# Patient Record
Sex: Female | Born: 1995 | Race: Black or African American | Hispanic: No | Marital: Single | Smoking: Never smoker
Health system: Southern US, Community
[De-identification: ages and names within clinical notes are randomized; demographics above are authoritative.]

## PROBLEM LIST (undated history)

## (undated) DIAGNOSIS — E669 Obesity, unspecified: Secondary | ICD-10-CM

## (undated) DIAGNOSIS — G932 Benign intracranial hypertension: Secondary | ICD-10-CM

## (undated) DIAGNOSIS — J45909 Unspecified asthma, uncomplicated: Secondary | ICD-10-CM

## (undated) HISTORY — PX: OTHER SURGICAL HISTORY: SHX169

## (undated) HISTORY — DX: Benign intracranial hypertension: G93.2

---
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
## 2014-08-27 ENCOUNTER — Emergency Department (INDEPENDENT_AMBULATORY_CARE_PROVIDER_SITE_OTHER)
Admission: EM | Admit: 2014-08-27 | Discharge: 2014-08-27 | Disposition: A | Discharge status: Home / Self Care | Payer: PRIVATE HEALTH INSURANCE | Source: Home / Self Care | Attending: Family Medicine | Admitting: Family Medicine

## 2014-08-27 ENCOUNTER — Encounter (HOSPITAL_COMMUNITY): Payer: Self-pay | Admitting: Emergency Medicine

## 2014-08-27 DIAGNOSIS — J4531 Mild persistent asthma with (acute) exacerbation: Secondary | ICD-10-CM

## 2014-08-27 HISTORY — DX: Unspecified asthma, uncomplicated: J45.909

## 2014-08-27 MED ORDER — ALBUTEROL SULFATE (2.5 MG/3ML) 0.083% IN NEBU
5.0000 mg | INHALATION_SOLUTION | Freq: Once | RESPIRATORY_TRACT | Status: AC
  Administered 2014-08-27: 5 mg via RESPIRATORY_TRACT

## 2014-08-27 MED ORDER — ALBUTEROL SULFATE (2.5 MG/3ML) 0.083% IN NEBU
INHALATION_SOLUTION | RESPIRATORY_TRACT | Status: AC
  Filled 2014-08-27: qty 3

## 2014-08-27 MED ORDER — ALBUTEROL SULFATE (2.5 MG/3ML) 0.083% IN NEBU
INHALATION_SOLUTION | RESPIRATORY_TRACT | Status: AC
  Filled 2014-08-27: qty 6

## 2014-08-27 MED ORDER — TRIAMCINOLONE ACETONIDE 40 MG/ML IJ SUSP
INTRAMUSCULAR | Status: AC
  Filled 2014-08-27: qty 1

## 2014-08-27 MED ORDER — METHYLPREDNISOLONE 4 MG PO KIT: PACK | ORAL | Status: DC

## 2014-08-27 MED ORDER — TRIAMCINOLONE ACETONIDE 40 MG/ML IJ SUSP
40.0000 mg | Freq: Once | INTRAMUSCULAR | Status: AC
  Administered 2014-08-27: 40 mg via INTRAMUSCULAR

## 2014-08-27 MED ORDER — IPRATROPIUM BROMIDE 0.02 % IN SOLN
0.5000 mg | Freq: Once | RESPIRATORY_TRACT | Status: AC
  Administered 2014-08-27: 0.5 mg via RESPIRATORY_TRACT

## 2014-08-27 MED ORDER — METHYLPREDNISOLONE ACETATE 40 MG/ML IJ SUSP
80.0000 mg | Freq: Once | INTRAMUSCULAR | Status: AC
  Administered 2014-08-27: 80 mg via INTRAMUSCULAR

## 2014-08-27 MED ORDER — METHYLPREDNISOLONE ACETATE 80 MG/ML IJ SUSP
INTRAMUSCULAR | Status: AC
  Filled 2014-08-27: qty 1

## 2014-08-27 MED ORDER — IPRATROPIUM BROMIDE 0.02 % IN SOLN
RESPIRATORY_TRACT | Status: AC
  Filled 2014-08-27: qty 2.5

## 2014-08-27 MED ORDER — ALBUTEROL SULFATE HFA 108 (90 BASE) MCG/ACT IN AERS: 1.0000 | INHALATION_SPRAY | Freq: Four times a day (QID) | RESPIRATORY_TRACT | Status: DC | PRN

## 2014-08-27 NOTE — Discharge Instructions (Signed)
See your doctor on wed for follow-up as planned.

## 2014-08-27 NOTE — ED Notes (Signed)
Reports having sob for several months.   States was told by provider that she wasn't using inhaler correctly.  Having no relief with asthma meds.

## 2014-08-27 NOTE — ED Provider Notes (Signed)
CSN: [id number]     Arrival date & time 08/27/14  1740 History   First MD Initiated Contact with Patient 08/27/14 1756     Chief Complaint  Patient presents with  . Asthma  . Shortness of Breath   (Consider location/radiation/quality/duration/timing/severity/associated sxs/prior Treatment) Patient is a 18 y.o. female presenting with asthma and shortness of breath. The history is provided by the patient.  Asthma This is a new problem. The current episode started more than 1 week ago (20mo h/o asthma flare.). Associated symptoms include shortness of breath.  Shortness of Breath Associated symptoms: cough and wheezing     Past Medical History  Diagnosis Date  . Asthma    History reviewed. No pertinent past surgical history. History reviewed. No pertinent family history. History  Substance Use Topics  . Smoking status: Never Smoker   . Smokeless tobacco: Not on file  . Alcohol Use: No   OB History   Grav Para Term Preterm Abortions TAB SAB Ect Mult Living                 Review of Systems  Constitutional: Negative.   HENT: Negative.   Respiratory: Positive for cough, shortness of breath and wheezing.     Allergies  Review of patient's allergies indicates no known allergies.  Home Medications   Prior to Admission medications   Medication Sig Start Date End Date Taking? Authorizing Provider  albuterol (PROVENTIL HFA;VENTOLIN HFA) 108 (90 BASE) MCG/ACT inhaler Inhale into the lungs every 6 (six) hours as needed for wheezing or shortness of breath.   Yes Historical Provider, MD  beclomethasone (QVAR) 40 MCG/ACT inhaler Inhale into the lungs 2 (two) times daily.   Yes Historical Provider, MD  methylPREDNISolone (MEDROL DOSEPAK) 4 MG tablet follow package directions, start on wed, take until finished. 08/27/14   Linna HoffJames D Kindl, MD   BP 117/67  Pulse 103  Temp(Src) 99 F (37.2 C) (Oral)  Resp 16  SpO2 100%  LMP 08/22/2014 Physical Exam  Nursing note and vitals  reviewed. Constitutional: She is oriented to person, place, and time. She appears well-developed and well-nourished.  Neck: Normal range of motion. Neck supple.  Cardiovascular: Regular rhythm, normal heart sounds and intact distal pulses.   Pulmonary/Chest: Effort normal. She has wheezes.  Neurological: She is alert and oriented to person, place, and time.  Skin: Skin is warm and dry.    ED Course  Procedures (including critical care time) Labs Review Labs Reviewed - No data to display Peak flow 220. Imaging Review No results found.   MDM   1. Asthma exacerbation attacks, mild persistent    Peak flow 300 prior to 2nd neb, still wheezing. Peak flow after 2nd neb prior to d/c was 320. Sx improved from onset.  Linna HoffJames D Kindl, MD 08/27/14 [phone]

## 2015-04-18 ENCOUNTER — Encounter (HOSPITAL_COMMUNITY): Payer: Self-pay

## 2015-04-18 ENCOUNTER — Emergency Department (HOSPITAL_COMMUNITY)
Admission: EM | Admit: 2015-04-18 | Discharge: 2015-04-18 | Discharge status: Absent without leave | Payer: Self-pay | Source: Home / Self Care

## 2015-04-18 ENCOUNTER — Emergency Department (HOSPITAL_COMMUNITY)
Admission: EM | Admit: 2015-04-18 | Discharge: 2015-04-18 | Disposition: A | Discharge status: Home / Self Care | Payer: PRIVATE HEALTH INSURANCE | Source: Home / Self Care | Attending: Family Medicine | Admitting: Family Medicine

## 2015-04-18 DIAGNOSIS — J020 Streptococcal pharyngitis: Secondary | ICD-10-CM | POA: Diagnosis not present

## 2015-04-18 LAB — POCT RAPID STREP A: Streptococcus, Group A Screen (Direct): POSITIVE — AB

## 2015-04-18 MED ORDER — AMOXICILLIN 500 MG PO CAPS: 500.0000 mg | ORAL_CAPSULE | Freq: Three times a day (TID) | ORAL | Status: DC

## 2015-04-18 NOTE — Discharge Instructions (Signed)
Drink lots of fluids, take all of medicine, use lozenges as needed.return if needed °

## 2015-04-18 NOTE — ED Notes (Signed)
Sore throat. MD eval

## 2015-04-18 NOTE — ED Provider Notes (Signed)
CSN: [id number]     Arrival date & time 04/18/15  1435 History   First MD Initiated Contact with Patient 04/18/15 1450     Chief Complaint  Patient presents with  . Sore Throat   (Consider location/radiation/quality/duration/timing/severity/associated sxs/prior Treatment) Patient is a 19 y.o. female presenting with pharyngitis. The history is provided by the patient.  Sore Throat This is a new problem. The current episode started 2 days ago. The problem has been gradually worsening. Pertinent negatives include no chest pain, no abdominal pain, no headaches and no shortness of breath. The symptoms are aggravated by swallowing.    Past Medical History  Diagnosis Date  . Asthma    History reviewed. No pertinent past surgical history. History reviewed. No pertinent family history. History  Substance Use Topics  . Smoking status: Never Smoker   . Smokeless tobacco: Not on file  . Alcohol Use: No   OB History    No data available     Review of Systems  Constitutional: Positive for fever, chills, activity change and appetite change.  HENT: Positive for sore throat. Negative for congestion, postnasal drip and rhinorrhea.   Respiratory: Negative for shortness of breath.   Cardiovascular: Negative.  Negative for chest pain.  Gastrointestinal: Negative.  Negative for abdominal pain.  Skin: Negative for rash.  Neurological: Negative for headaches.    Allergies  Review of patient's allergies indicates no known allergies.  Home Medications   Prior to Admission medications   Medication Sig Start Date End Date Taking? Authorizing Provider  albuterol (PROVENTIL HFA;VENTOLIN HFA) 108 (90 BASE) MCG/ACT inhaler Inhale into the lungs every 6 (six) hours as needed for wheezing or shortness of breath.    Historical Provider, MD  albuterol (PROVENTIL HFA;VENTOLIN HFA) 108 (90 BASE) MCG/ACT inhaler Inhale 1-2 puffs into the lungs every 6 (six) hours as needed for wheezing or shortness of breath.  08/27/14   Linna HoffJames D Earl Zellmer, MD  amoxicillin (AMOXIL) 500 MG capsule Take 1 capsule (500 mg total) by mouth 3 (three) times daily. 04/18/15   Linna HoffJames D Othello Sgroi, MD  beclomethasone (QVAR) 40 MCG/ACT inhaler Inhale into the lungs 2 (two) times daily.    Historical Provider, MD  methylPREDNISolone (MEDROL DOSEPAK) 4 MG tablet follow package directions, start on wed, take until finished. 08/27/14   Linna HoffJames D Tyreke Kaeser, MD   BP 109/78 mmHg  Pulse 113  Temp(Src) 99.4 F (37.4 C) (Oral)  Resp 16  SpO2 99% Physical Exam  Constitutional: She is oriented to person, place, and time. She appears well-developed and well-nourished.  HENT:  Right Ear: External ear normal.  Left Ear: External ear normal.  Mouth/Throat: Oropharyngeal exudate present.  Neck: Normal range of motion. Neck supple.  Cardiovascular: Normal heart sounds.   Pulmonary/Chest: Breath sounds normal.  Lymphadenopathy:    She has cervical adenopathy.  Neurological: She is alert and oriented to person, place, and time.  Skin: Skin is warm and dry.  Nursing note and vitals reviewed.   ED Course  Procedures (including critical care time) Labs Review Labs Reviewed  POCT RAPID STREP A - Abnormal; Notable for the following:    Streptococcus, Group A Screen (Direct) POSITIVE (*)    All other components within normal limits   Strep positive  Imaging Review No results found.   MDM   1. Streptococcal sore throat        Linna HoffJames D Meghan Tiemann, MD 04/18/15 [phone]

## 2016-08-11 ENCOUNTER — Ambulatory Visit (HOSPITAL_COMMUNITY)
Admission: EM | Admit: 2016-08-11 | Discharge: 2016-08-11 | Disposition: A | Discharge status: Home / Self Care | Payer: PRIVATE HEALTH INSURANCE | Attending: Internal Medicine | Admitting: Internal Medicine

## 2016-08-11 ENCOUNTER — Encounter (HOSPITAL_COMMUNITY): Payer: Self-pay | Admitting: Emergency Medicine

## 2016-08-11 DIAGNOSIS — H6503 Acute serous otitis media, bilateral: Secondary | ICD-10-CM

## 2016-08-11 NOTE — Discharge Instructions (Signed)
Use the medications that you have at home.   Flonase may help your symptoms.  There is no infection in your ears at this time.

## 2016-08-11 NOTE — ED Triage Notes (Signed)
[facility] a [facility] of bilateral ear fullness x 1 month.

## 2016-08-11 NOTE — ED Provider Notes (Signed)
CSN: [id number]     Arrival date & time 08/11/16  1424 History   None    Chief Complaint  Patient presents with  . Ear Fullness   (Consider location/radiation/quality/duration/timing/severity/associated sxs/prior Treatment) HPI 20 y/o female with sensation of wax buildup in both ears. Left is worse than right. Recent URI. No pain.  Has lots of meds at home including flonase. Similar symptoms in the past.   Past Medical History:  Diagnosis Date  . Asthma    History reviewed. No pertinent surgical history. History reviewed. No pertinent family history. Social History  Substance Use Topics  . Smoking status: Never Smoker  . Smokeless tobacco: Not on file  . Alcohol use No   OB History    No data available     Review of Systems  Denies: HEADACHE, NAUSEA, ABDOMINAL PAIN, CHEST PAIN, CONGESTION, DYSURIA, SHORTNESS OF BREATH  Allergies  Review of patient's allergies indicates no known allergies.  Home Medications   Prior to Admission medications   Medication Sig Start Date End Date Taking? Authorizing Provider  albuterol (PROVENTIL HFA;VENTOLIN HFA) 108 (90 BASE) MCG/ACT inhaler Inhale into the lungs every 6 (six) hours as needed for wheezing or shortness of breath.   Yes Historical Provider, MD  beclomethasone (QVAR) 40 MCG/ACT inhaler Inhale into the lungs 2 (two) times daily.   Yes Historical Provider, MD  albuterol (PROVENTIL HFA;VENTOLIN HFA) 108 (90 BASE) MCG/ACT inhaler Inhale 1-2 puffs into the lungs every 6 (six) hours as needed for wheezing or shortness of breath. 08/27/14   Linna HoffJames D Kindl, MD  amoxicillin (AMOXIL) 500 MG capsule Take 1 capsule (500 mg total) by mouth 3 (three) times daily. 04/18/15   Linna HoffJames D Kindl, MD  methylPREDNISolone (MEDROL DOSEPAK) 4 MG tablet follow package directions, start on wed, take until finished. 08/27/14   Linna HoffJames D Kindl, MD   Meds Ordered and Administered this Visit  Medications - No data to display  BP 131/87 (BP Location: Left Arm)    Pulse 83   Temp 98 F (36.7 C) (Oral)   Resp 20   SpO2 100%  No data found.   Physical Exam NURSES NOTES AND VITAL SIGNS REVIEWED. CONSTITUTIONAL: Well developed, well nourished, no acute distress HEENT: normocephalic, atraumatic, Left TM effusion without redness or bulging. Right TM normal. No cerumen buildup in either ear. , Throat clear.  EYES: Conjunctiva normal NECK:normal ROM, supple, no adenopathy PULMONARY:No respiratory distress, normal effort ABDOMINAL: Soft, ND, NT BS+, No CVAT MUSCULOSKELETAL: Normal ROM of all extremities,  SKIN: warm and dry without rash PSYCHIATRIC: Mood and affect, behavior are normal  Urgent Care Course   Clinical Course    Procedures (including critical care time)  Labs Review Labs Reviewed - No data to display  Imaging Review No results found.   Visual Acuity Review  Right Eye Distance:   Left Eye Distance:   Bilateral Distance:    Right Eye Near:   Left Eye Near:    Bilateral Near:         MDM   1. Bilateral acute serous otitis media, recurrence not specified     Patient is reassured that there are no issues that require transfer to higher level of care at this time or additional tests. Patient is advised to continue home symptomatic treatment. Patient is advised that if there are new or worsening symptoms to attend the emergency department, contact primary care provider, or return to UC. Instructions of care provided discharged home in stable condition.  THIS NOTE WAS GENERATED USING A VOICE RECOGNITION SOFTWARE PROGRAM. ALL REASONABLE EFFORTS  WERE MADE TO PROOFREAD THIS DOCUMENT FOR ACCURACY.  I have verbally reviewed the discharge instructions with the patient. A printed AVS was given to the patient.  All questions were answered prior to discharge.      Tharon Aquas, PA 08/11/16 1626

## 2017-04-18 ENCOUNTER — Encounter (HOSPITAL_COMMUNITY): Payer: Self-pay | Admitting: Emergency Medicine

## 2017-04-18 ENCOUNTER — Ambulatory Visit (HOSPITAL_COMMUNITY)
Admission: EM | Admit: 2017-04-18 | Discharge: 2017-04-18 | Disposition: A | Discharge status: Home / Self Care | Payer: PRIVATE HEALTH INSURANCE | Attending: Emergency Medicine | Admitting: Emergency Medicine

## 2017-04-18 DIAGNOSIS — K115 Sialolithiasis: Secondary | ICD-10-CM | POA: Diagnosis not present

## 2017-04-18 MED ORDER — IBUPROFEN 600 MG PO TABS: 600.0000 mg | ORAL_TABLET | Freq: Four times a day (QID) | ORAL | 0 refills | Status: DC | PRN

## 2017-04-18 NOTE — ED Triage Notes (Signed)
Left side of neck swollen and painful for 3-4 days.  Patient has a stuffy left ear

## 2017-04-18 NOTE — Discharge Instructions (Signed)
continue warm compresses, start  a sour hard candy such as warheads, sour patch kids or lemon drops, ibuprofen 600 mg 4 times a day when necessary.

## 2017-04-18 NOTE — ED Provider Notes (Signed)
HPI  SUBJECTIVE:  Kimberly Villa is a 21 y.o. female who presents with a painful, sore, mass inferior to her left jaw for the past week. States that it waxes and wanes in size, gets bigger with eating and salivating. She tried warm compresses, apple cider vinegar, ibuprofen 400-600 mg with some improvement in her symptoms. Symptoms are worse with eating. She denies fevers, sore throat, ear pain, dental pain or infection. No trismus, neck stiffness, drooling, difficulty breathing. No antibiotic in the past month. She took ibuprofen within 6-8 hours of evaluation. She has a past medical history of asthma. She is not a smoker. No history of sialolithiasis, diabetes, hypertension. LMP: IUD. Denies possibility of being pregnant. PMD: Dr. Parke Simmers at [facility]  Past Medical History:  Diagnosis Date  . Asthma     History reviewed. No pertinent surgical history.  No family history on file.  Social History  Substance Use Topics  . Smoking status: Never Smoker  . Smokeless tobacco: Not on file  . Alcohol use No    No current facility-administered medications for this encounter.   Current Outpatient Prescriptions:  .  albuterol (PROVENTIL HFA;VENTOLIN HFA) 108 (90 BASE) MCG/ACT inhaler, Inhale into the lungs every 6 (six) hours as needed for wheezing or shortness of breath., Disp: , Rfl:  .  albuterol (PROVENTIL HFA;VENTOLIN HFA) 108 (90 BASE) MCG/ACT inhaler, Inhale 1-2 puffs into the lungs every 6 (six) hours as needed for wheezing or shortness of breath., Disp: 1 Inhaler, Rfl: 0 .  beclomethasone (QVAR) 40 MCG/ACT inhaler, Inhale into the lungs 2 (two) times daily., Disp: , Rfl:  .  ibuprofen (ADVIL,MOTRIN) 600 MG tablet, Take 1 tablet (600 mg total) by mouth every 6 (six) hours as needed., Disp: 30 tablet, Rfl: 0  No Known Allergies   ROS  As noted in HPI.   Physical Exam  BP 138/72 (BP Location: Right Arm)   Pulse 87   Temp 98.2 F (36.8 C) (Oral)   Resp 18   SpO2 100%    Constitutional: Well developed, well nourished, no acute distress Eyes:  EOMI, conjunctiva normal bilaterally HENT: Normocephalic, atraumatic,mucus membranes moist. Nontender, firm mass inferior to the left jaw consistent with an enlarged salivary gland. Positive expressible clear saliva. No palpable stone or expressible purulent drainage from Wharton's or Stensen's duct. No parotid gland swelling, tenderness. No drooling, trismus. dentition normal, no tenderness or swelling along the gums Neck: No cervical lymphadenopathy Respiratory: Normal inspiratory effort Cardiovascular: Normal rate GI: nondistended skin: No rash, skin intact Musculoskeletal: no deformities Neurologic: Alert & oriented x 3, no focal neuro deficits Psychiatric: Speech and behavior appropriate   ED Course   Medications - No data to display  No orders of the defined types were placed in this encounter.   No results found for this or any previous visit (from the past 24 hour(s)). No results found.  ED Clinical Impression  Sialolithiasis   ED Assessment/Plan  No evidence of secondary infection. We'll have patient continue warm compresses, start  Sialogogues such war heads, sour patch kids or lemon drops, ibuprofen 600 mg 4 times a day when necessary. We'll have her follow-up with Dr. Jearld Fenton, ENT on call in a week if this is not completely resolved, to the ER for any signs of infection.  Discussed MDM, plan and followup with patient. Discussed sn/sx that should prompt return to the ED. Patient agrees with plan.   Meds ordered this encounter  Medications  . ibuprofen (ADVIL,MOTRIN) 600 MG tablet  Sig: Take 1 tablet (600 mg total) by mouth every 6 (six) hours as needed.    Dispense:  30 tablet    Refill:  0    *This clinic note was created using Scientist, clinical (histocompatibility and immunogenetics)Dragon dictation software. Therefore, there may be occasional mistakes despite careful proofreading.  ?   Domenick GongMortenson, Ramello Cordial, MD 04/18/17 1549

## 2017-05-05 ENCOUNTER — Ambulatory Visit (HOSPITAL_COMMUNITY)
Admission: EM | Admit: 2017-05-05 | Discharge: 2017-05-05 | Disposition: A | Discharge status: Home / Self Care | Payer: PRIVATE HEALTH INSURANCE | Attending: Family Medicine | Admitting: Family Medicine

## 2017-05-05 ENCOUNTER — Encounter (HOSPITAL_COMMUNITY): Payer: Self-pay | Admitting: Emergency Medicine

## 2017-05-05 DIAGNOSIS — K115 Sialolithiasis: Secondary | ICD-10-CM | POA: Diagnosis not present

## 2017-05-05 DIAGNOSIS — J309 Allergic rhinitis, unspecified: Secondary | ICD-10-CM

## 2017-05-05 MED ORDER — TRIAMCINOLONE ACETONIDE 0.1 % EX CREA: 1.0000 "application " | TOPICAL_CREAM | Freq: Two times a day (BID) | CUTANEOUS | 1 refills | Status: DC

## 2017-05-05 NOTE — Discharge Instructions (Signed)
I provided the contact information for ear, nose, and throat physician. Contact his office to set up an appointment for follow-up care.  With regards to your congestion, and ear pressure, most likely of allergic rhinitis, start taking your Zyrtec daily, and I also recommend Flonase 2 sprays each nostril daily. If symptoms persist, follow-up with ENT.

## 2017-05-05 NOTE — ED Triage Notes (Signed)
Pt reports a lot of fullness in the left ear.  She states this is a chronic problem.  She has tried ear wax removal kits at home with no relief.

## 2017-05-05 NOTE — ED Provider Notes (Signed)
CSN: [id number]     Arrival date & time 05/05/17  1345 History   First MD Initiated Contact with Patient 05/05/17 1404     Chief Complaint  Patient presents with  . Ear Fullness    left   (Consider location/radiation/quality/duration/timing/severity/associated sxs/prior Treatment) Vista Sawatzky is a 21 y.o. female with a past history of asthma, who presents [facility] with a chief complaint of salivary stone. She was seen in the clinic on 04/18/2017, diagnosed with a salivary stone then, was instructed that she may need follow-up with ENT if the symptoms persisted. She is here today to obtain a referral. She also has a second complaint of ear pain and pressure, ongoing for 3-4 days, denies any ringing in the ear, no fever, does have congestion, no nausea, vomiting, diarrhea, or other systemic complaints. She takes over-the-counter Zyrtec for allergies, however is been out for a few weeks, and she is also requesting a refill for triamcinolone cream for her eczema.   The history is provided by the patient.    Past Medical History:  Diagnosis Date  . Asthma    History reviewed. No pertinent surgical history. History reviewed. No pertinent family history. Social History  Substance Use Topics  . Smoking status: Never Smoker  . Smokeless tobacco: Never Used  . Alcohol use No   OB History    No data available     Review of Systems  Constitutional: Negative.   HENT: Positive for congestion, ear pain and facial swelling. Negative for drooling, ear discharge, rhinorrhea, sinus pain, sinus pressure and sore throat.   Respiratory: Negative.   Cardiovascular: Negative.   Gastrointestinal: Negative.   Musculoskeletal: Negative.   Skin: Negative.   Neurological: Negative.     Allergies  Patient has no known allergies.  Home Medications   Prior to Admission medications   Medication Sig Start Date End Date Taking? Authorizing Provider  albuterol (PROVENTIL  HFA;VENTOLIN HFA) 108 (90 BASE) MCG/ACT inhaler Inhale into the lungs every 6 (six) hours as needed for wheezing or shortness of breath.   Yes [provider]  beclomethasone (QVAR) 40 MCG/ACT inhaler Inhale into the lungs 2 (two) times daily.   Yes [provider]  triamcinolone cream (KENALOG) 0.1 % Apply 1 application topically 2 (two) times daily. 05/05/17   Dorena Bodo, NP   Meds Ordered and Administered this Visit  Medications - No data to display  BP 130/85 (BP Location: Right Arm)   Pulse 94   Temp 98.9 F (37.2 C) (Oral)   SpO2 99%  No data found.   Physical Exam  Constitutional: She is oriented to person, place, and time. She appears well-developed and well-nourished. No distress.  HENT:  Head: Normocephalic and atraumatic.  Right Ear: Tympanic membrane and external ear normal.  Left Ear: Tympanic membrane and external ear normal.  Nose: Mucosal edema and rhinorrhea present.  Mouth/Throat: Oropharynx is clear and moist.  Eyes: Conjunctivae are normal.  Neck: Normal range of motion. Neck supple.  Cardiovascular: Normal rate and regular rhythm.   Pulmonary/Chest: Effort normal and breath sounds normal.  Lymphadenopathy:       Head (left side): Submandibular and tonsillar adenopathy present.    She has no cervical adenopathy.  Neurological: She is alert and oriented to person, place, and time.  Skin: Skin is warm and dry. Capillary refill takes less than 2 seconds. No rash noted. She is not diaphoretic. No erythema.  Psychiatric: She has a normal  mood and affect. Her behavior is normal.  Nursing note and vitals reviewed.   Urgent Care Course     Procedures (including critical care time)  Labs Review Labs Reviewed - No data to display  Imaging Review No results found.   Visual Acuity Review  Right Eye Distance:   Left Eye Distance:   Bilateral Distance:    Right Eye Near:   Left Eye Near:    Bilateral Near:         MDM   1.  Salivary stone   2. Allergic rhinitis, unspecified seasonality, unspecified trigger     Lenward Chancellorekeyla Meth is a 21 y.o. female with a past history of asthma, who [facility] with a chief complaint of salivary stone. She was seen in the clinic on 04/18/2017, diagnosed with a salivary stone then, was instructed that she may need follow-up with ENT if the symptoms persisted. She is here today to obtain a referral. She also has a second complaint of ear pain and pressure, ongoing for 3-4 days, denies any ringing in the ear, no fever, does have congestion, no nausea, vomiting, diarrhea, or other systemic complaints. She takes over-the-counter Zyrtec for allergies, however is been out for a few weeks, and she is also requesting a refill for triamcinolone cream for her eczema.  Referral made to GI so ENT for possible salivary stone, or ear pain and pressure is most likely related to her seasonal allergies, recommend over-the-counter Zyrtec, and Flonase daily. She was also provided a refill on her triamcinolone. Follow up with ENT for further evaluation and management of these conditions.     Dorena BodoKennard, Kaye Mitro, NP 05/05/17 1423

## 2017-05-17 ENCOUNTER — Encounter: Payer: Self-pay | Admitting: Family Medicine

## 2017-05-17 ENCOUNTER — Ambulatory Visit (INDEPENDENT_AMBULATORY_CARE_PROVIDER_SITE_OTHER): Payer: PRIVATE HEALTH INSURANCE | Admitting: Family Medicine

## 2017-05-17 VITALS — BP 117/76 | HR 99 | Temp 98.4°F | Resp 16 | Ht 63.5 in | Wt 223.2 lb

## 2017-05-17 DIAGNOSIS — L309 Dermatitis, unspecified: Secondary | ICD-10-CM | POA: Diagnosis not present

## 2017-05-17 DIAGNOSIS — J309 Allergic rhinitis, unspecified: Secondary | ICD-10-CM

## 2017-05-17 DIAGNOSIS — Z6838 Body mass index (BMI) 38.0-38.9, adult: Secondary | ICD-10-CM

## 2017-05-17 DIAGNOSIS — E6609 Other obesity due to excess calories: Secondary | ICD-10-CM | POA: Diagnosis not present

## 2017-05-17 DIAGNOSIS — J4520 Mild intermittent asthma, uncomplicated: Secondary | ICD-10-CM | POA: Diagnosis not present

## 2017-05-17 MED ORDER — MONTELUKAST SODIUM 10 MG PO TABS: 10.0000 mg | ORAL_TABLET | Freq: Every day | ORAL | 11 refills | Status: DC

## 2017-05-17 MED ORDER — CETIRIZINE HCL 10 MG PO TABS: 10.0000 mg | ORAL_TABLET | Freq: Every day | ORAL | 11 refills | Status: DC

## 2017-05-17 MED ORDER — FLUTICASONE PROPIONATE 50 MCG/ACT NA SUSP: 2.0000 | Freq: Every day | NASAL | 6 refills | Status: DC

## 2017-05-17 NOTE — Patient Instructions (Addendum)
   IF you received an x-ray today, you will receive an invoice from [facility]. Please contact [facility] at [phone] with questions or concerns regarding your invoice.   IF you received labwork today, you will receive an invoice from [facility]. Please contact [facility] at [phone] with questions or concerns regarding your invoice.   Our billing staff will not be able to assist you with questions regarding bills from these companies.  You will be contacted with the lab results as soon as they are available. The fastest way to get your results is to activate your My Chart account. Instructions are located on the last page of this paperwork. If you have not heard from us regarding the results in 2 weeks, please contact this office.    Eczema Eczema, also called atopic dermatitis, is a skin disorder that causes inflammation of the skin. It causes a red rash and dry, scaly skin. The skin becomes very itchy. Eczema is generally worse during the cooler winter months and often improves with the warmth of summer. Eczema usually starts showing signs in infancy. Some children outgrow eczema, but it may last through adulthood. What are the causes? The exact cause of eczema is not known, but it appears to run in families. People with eczema often have a family history of eczema, allergies, asthma, or hay fever. Eczema is not contagious. Flare-ups of the condition may be caused by:  Contact with something you are sensitive or allergic to.  Stress.  What are the signs or symptoms?  Dry, scaly skin.  Red, itchy rash.  Itchiness. This may occur before the skin rash and may be very intense. How is this diagnosed? The diagnosis of eczema is usually made based on symptoms and medical history. How is this treated? Eczema cannot be cured, but symptoms usually can be controlled with treatment and other strategies. A treatment plan might include:  Controlling the itching and  scratching. ? Use over-the-counter antihistamines as directed for itching. This is especially useful at night when the itching tends to be worse. ? Use over-the-counter steroid creams as directed for itching. ? Avoid scratching. Scratching makes the rash and itching worse. It may also result in a skin infection (impetigo) due to a break in the skin caused by scratching.  Keeping the skin well moisturized with creams every day. This will seal in moisture and help prevent dryness. Lotions that contain alcohol and water should be avoided because they can dry the skin.  Limiting exposure to things that you are sensitive or allergic to (allergens).  Recognizing situations that cause stress.  Developing a plan to manage stress.  Follow these instructions at home:  Only take over-the-counter or prescription medicines as directed by your health care provider.  Do not use anything on the skin without checking with your health care provider.  Keep baths or showers short (5 minutes) in warm (not hot) water. Use mild cleansers for bathing. These should be unscented. You may add nonperfumed bath oil to the bath water. It is best to avoid soap and bubble bath.  Immediately after a bath or shower, when the skin is still damp, apply a moisturizing ointment to the entire body. This ointment should be a petroleum ointment. This will seal in moisture and help prevent dryness. The thicker the ointment, the better. These should be unscented.  Keep fingernails cut short. Children with eczema may need to wear soft gloves or mittens at night after applying an ointment.  Dress in   clothes made of cotton or cotton blends. Dress lightly, because heat increases itching.  A child with eczema should stay away from anyone with fever blisters or cold sores. The virus that causes fever blisters (herpes simplex) can cause a serious skin infection in children with eczema. Contact a health care provider if:  Your itching  interferes with sleep.  Your rash gets worse or is not better within 1 week after starting treatment.  You see pus or soft yellow scabs in the rash area.  You have a fever.  You have a rash flare-up after contact with someone who has fever blisters. This information is not intended to replace advice given to you by your health care provider. Make sure you discuss any questions you have with your health care provider. Document Released: 10/29/2000 Document Revised: 04/08/2016 Document Reviewed: 06/04/2013 Elsevier Interactive Patient Education  2017 [employer]  

## 2017-05-17 NOTE — Progress Notes (Signed)
Chief Complaint  Patient presents with  . ear wash    pt says she can't hear when people when they talk and sounds are muffled, onset:36yr, sometimes they pop and she can hear out of one but not the other.    HPI   Pt reports intermittent history of decreased hearing that has been eavluated preivously and required flushing She is taking flonase and qvar She feels like her ears are plugged up  asthma She has a history of asthma that is well controlled with albuterol prn and qvar daily She uses her albuterol about twice a week Her triggers are environmental  Allergic rhinitis - chronic  She takes zyrtec and flonase She gets nasal and sinus congestion  She reports that she also gets ear fullness  Past Medical History:  Diagnosis Date  . Asthma     Current Outpatient Prescriptions  Medication Sig Dispense Refill  . albuterol (PROVENTIL HFA;VENTOLIN HFA) 108 (90 BASE) MCG/ACT inhaler Inhale into the lungs every 6 (six) hours as needed for wheezing or shortness of breath.    . beclomethasone (QVAR) 40 MCG/ACT inhaler Inhale into the lungs 2 (two) times daily.    Marland Kitchen triamcinolone cream (KENALOG) 0.1 % Apply 1 application topically 2 (two) times daily. 30 g 1  . cetirizine (ZYRTEC) 10 MG tablet Take 1 tablet (10 mg total) by mouth daily. 30 tablet 11  . fluticasone (FLONASE) 50 MCG/ACT nasal spray Place 2 sprays into both nostrils daily. 16 g 6  . montelukast (SINGULAIR) 10 MG tablet Take 1 tablet (10 mg total) by mouth at bedtime. 90 tablet 11   No current facility-administered medications for this visit.     Allergies: No Known Allergies  History reviewed. No pertinent surgical history.  Social History   Social History  . Marital status: Single    Spouse name: N/A  . Number of children: N/A  . Years of education: N/A   Social History Main Topics  . Smoking status: Never Smoker  . Smokeless tobacco: Never Used  . Alcohol use No  . Drug use: No  . Sexual activity: Yes     Other Topics Concern  . None   Social History Narrative  . None    ROS See hpi Review of Systems See HPI Constitution: No fevers or chills No malaise No diaphoresis Skin: No rash or itching Eyes: no blurry vision, no double vision GU: no dysuria or hematuria Neuro: no dizziness or headaches  Objective: Vitals:   05/17/17 1405  BP: 117/76  Pulse: 99  Resp: 16  Temp: 98.4 F (36.9 C)  TempSrc: Oral  SpO2: (!) 76%  Weight: 223 lb 3.2 oz (101.2 kg)  Height: 5' 3.5" (1.613 m)  Body mass index is 38.92 kg/m.   Physical Exam General: alert, oriented, in NAD Head: normocephalic, atraumatic, no sinus tenderness Eyes: EOM intact, no scleral icterus or conjunctival injection Ears: TM clear bilaterally Nose: mucosa nonerythematous, nonedematous Throat: no pharyngeal exudate or erythema Lymph: no posterior auricular, submental or cervical lymph adenopathy Heart: normal rate, normal sinus rhythm, no murmurs Lungs: clear to auscultation bilaterally, no wheezing Skin: hyperpigmentation from eczema on knuckles, neck and ears  Assessment and Plan Kimberly Villa was seen today for ear wash.  Diagnoses and all orders for this visit:  Mild intermittent asthma without complication- discussed compliance and also talked about addition of singulair and avoidance of triggers  Chronic allergic rhinitis - continue flonase, zyrtec, add singulair  Chronic eczema- continue zyrtec, add singulair  Obesity - BMI 38.92 Discussed consistent exercise of at least 10 minutes a day every day   Other orders -     montelukast (SINGULAIR) 10 MG tablet; Take 1 tablet (10 mg total) by mouth at bedtime. -     fluticasone (FLONASE) 50 MCG/ACT nasal spray; Place 2 sprays into both nostrils daily. -     cetirizine (ZYRTEC) 10 MG tablet; Take 1 tablet (10 mg total) by mouth daily.     Kimberly Villa A Kimberly Villa

## 2018-05-19 ENCOUNTER — Other Ambulatory Visit: Payer: Self-pay | Admitting: Family Medicine

## 2018-05-19 NOTE — Telephone Encounter (Signed)
Interface request refill for Zyrtec     LOV:  05/17/17 with Stallinigs, Zoe   Last refill:  11/23/17  Pharmacy  [facility]

## 2018-08-19 ENCOUNTER — Other Ambulatory Visit: Payer: Self-pay | Admitting: Family Medicine

## 2018-08-21 NOTE — Telephone Encounter (Signed)
Patient called and advised she will need to be seen in the office by Dr. Creta Levin before refilling her medications, she says she will have to call back when she can look at her schedule. I advised I will have to refuse the requested Flonase, she verbalized understanding.

## 2018-09-21 ENCOUNTER — Other Ambulatory Visit: Payer: Self-pay | Admitting: Family Medicine

## 2018-09-21 NOTE — Telephone Encounter (Signed)
TC to patient. Left VM to make appointment before refills can be approved.

## 2019-06-18 ENCOUNTER — Telehealth: Payer: Self-pay | Admitting: Neurology

## 2019-06-18 ENCOUNTER — Encounter: Payer: Self-pay | Admitting: Neurology

## 2019-06-18 ENCOUNTER — Other Ambulatory Visit: Payer: Self-pay

## 2019-06-18 ENCOUNTER — Ambulatory Visit: Payer: PRIVATE HEALTH INSURANCE | Admitting: Neurology

## 2019-06-18 VITALS — BP 150/90 | HR 116 | Temp 98.2°F | Ht 63.0 in | Wt 240.0 lb

## 2019-06-18 DIAGNOSIS — H5462 Unqualified visual loss, left eye, normal vision right eye: Secondary | ICD-10-CM | POA: Diagnosis not present

## 2019-06-18 DIAGNOSIS — G441 Vascular headache, not elsewhere classified: Secondary | ICD-10-CM

## 2019-06-18 DIAGNOSIS — E669 Obesity, unspecified: Secondary | ICD-10-CM

## 2019-06-18 DIAGNOSIS — G932 Benign intracranial hypertension: Secondary | ICD-10-CM | POA: Diagnosis not present

## 2019-06-18 DIAGNOSIS — H4710 Unspecified papilledema: Secondary | ICD-10-CM

## 2019-06-18 DIAGNOSIS — H9190 Unspecified hearing loss, unspecified ear: Secondary | ICD-10-CM

## 2019-06-18 NOTE — Patient Instructions (Signed)
-Remove IUD  - Weight loss: refer to [facility]: they will call you   - MRI of the brain and blood Vessels  - Lumbar Puncture  - Will start Acetazolamide after workup (Diamox)  - f/u 4-6 weeks   Idiopathic Intracranial Hypertension  Idiopathic intracranial hypertension (IIH) is a condition that increases pressure around the brain. The fluid that surrounds the brain and spinal cord (cerebrospinal fluid, CSF) increases and causes the pressure. Idiopathic means that the cause of this condition is not known. IIH affects the brain and spinal cord (is a neurological disorder). If this condition is not treated, it can cause vision loss or blindness. What increases the risk? You are more likely to develop this condition if:  You are severely overweight (obese).  You are a woman who has not gone through menopause.  You take certain medicines, such as birth control or steroids. What are the signs or symptoms? Symptoms of IIH include:  Headaches. This is the most common symptom.  Pain in the shoulders or neck.  Nausea and vomiting.  A "rushing water" or pulsing sound within the ears (pulsatile tinnitus).  Double vision.  Blurred vision.  Brief episodes of complete vision loss. How is this diagnosed? This condition may be diagnosed based on:  Your symptoms.  Your medical history.  CT scan of the brain.  MRI of the brain.  Magnetic resonance venogram (MRV) to check veins in the brain.  Diagnostic lumbar puncture. This is a procedure to remove and examine a sample of cerebrospinal fluid. This procedure can determine whether too much fluid may be causing IIH.  A thorough eye exam to check for swelling or nerve damage in the eyes. How is this treated? Treatment for this condition depends on your symptoms. The goal of treatment is to decrease the pressure around your brain. Common treatments include:  Medicines to decrease the production of spinal  fluid and lower the pressure within your skull.  Medicines to prevent or treat headaches.  Surgery to place drains (shunts) in your brain to remove excess fluid.  Lumbar puncture to remove excess cerebrospinal fluid. Follow these instructions at home:  If you are overweight or obese, work with your health care provider to lose weight.  Take over-the-counter and prescription medicines only as told by your health care provider.  Do not drive or use heavy machinery while taking medicines that can make you sleepy.  Keep all follow-up visits as told by your health care provider. This is important. Contact a health care provider if:  You have changes in your vision, such as: ? Double vision. ? Not being able to see colors (color vision). Get help right away if:  You have any of the following symptoms and they get worse or do not get better. ? Headaches. ? Nausea. ? Vomiting. ? Vision changes or difficulty seeing. Summary  Idiopathic intracranial hypertension (IIH) is a condition that increases pressure around the brain. The cause is not known (is idiopathic).  The most common symptom of IIH is headaches.  Treatment may include medicines or surgery to relieve the pressure on your brain. This information is not intended to replace advice given to you by your health care provider. Make sure you discuss any questions you have with your health care provider. Document Released: 01/10/2002 Document Revised: 10/14/2017 Document Reviewed: 09/22/2016 Elsevier Patient Education  [place]. Acetazolamide tablets What is this medicine? ACETAZOLAMIDE (a set a ZOLE a mide) is used to treat  glaucoma and some seizure disorders. It may be used to treat edema or swelling from heart failure or from other medicines. This medicine is also used to treat and to prevent altitude or mountain sickness. This medicine may be used for other purposes; ask your health care provider or pharmacist if you  have questions. COMMON BRAND NAME(S): Diamox What should I tell my health care provider before I take this medicine? They need to know if you have any of these conditions:  diabetes  kidney disease  liver disease  lung disease  an unusual or allergic reaction to acetazolamide, sulfa drugs, other medicines, foods, dyes, or preservatives  pregnant or trying to get pregnant  breast-feeding How should I use this medicine? Take this medicine by mouth with a glass of water. Follow the directions on the prescription label. Take this medicine with food if it upsets your stomach. Take your doses at regular intervals. Do not take your medicine more often than directed. Do not stop taking except on your doctor's advice. Talk to your pediatrician regarding the use of this medicine in children. Special care may be needed. Patients over 24 years old may have a stronger reaction and need a smaller dose. Overdosage: If you think you have taken too much of this medicine contact a poison control center or emergency room at once. NOTE: This medicine is only for you. Do not share this medicine with others. What if I miss a dose? If you miss a dose, take it as soon as you can. If it is almost time for your next dose, take only that dose. Do not take double or extra doses. What may interact with this medicine? Do not take this medicine with any of the following medications:  methazolamide This medicine may also interact with the following medications:  aspirin and aspirin-like medicines  cyclosporine  lithium  medicine for diabetes  methenamine  other diuretics  phenytoin  primidone  quinidine  sodium bicarbonate  stimulant medicines like dextroamphetamine This list may not describe all possible interactions. Give your health care provider a list of all the medicines, herbs, non-prescription drugs, or dietary supplements you use. Also tell them if you smoke, drink alcohol, or use  illegal drugs. Some items may interact with your medicine. What should I watch for while using this medicine? Visit your doctor or health care professional for regular checks on your progress. You will need blood work done regularly. If you are diabetic, check your blood sugar as directed. You may need to be on a special diet while taking this medicine. Ask your doctor. Also, ask how many glasses of fluid you need to drink a day. You must not get dehydrated. You may get drowsy or dizzy. Do not drive, use machinery, or do anything that needs mental alertness until you know how this medicine affects you. Do not stand or sit up quickly, especially if you are an older patient. This reduces the risk of dizzy or fainting spells. This medicine can make you more sensitive to the sun. Keep out of the sun. If you cannot avoid being in the sun, wear protective clothing and use sunscreen. Do not use sun lamps or tanning beds/booths. What side effects may I notice from receiving this medicine? Side effects that you should report to your doctor or health care professional as soon as possible:  allergic reactions like skin rash, itching or hives, swelling of the face, lips, or tongue  breathing problems  confusion, depression  dark urine  fever  numbness, tingling in hands or feet  redness, blistering, peeling or loosening of the skin, including inside the mouth  ringing in the ears  seizures  unusually weak or tired  yellowing of the eyes or skin Side effects that usually do not require medical attention (report to your doctor or health care professional if they continue or are bothersome):  change in taste  diarrhea  headache  loss of appetite  nausea, vomiting  passing urine more often This list may not describe all possible side effects. Call your doctor for medical advice about side effects. You may report side effects to FDA at 1-800-FDA-1088. Where should I keep my medicine? Keep  out of the reach of children. Store at room temperature between 20 and 25 degrees C (68 and 77 degrees F). Throw away any unused medicine after the expiration date. NOTE: This sheet is a summary. It may not cover all possible information. If you have questions about this medicine, talk to your doctor, pharmacist, or health care provider.  2020 Elsevier/Gold Standard (2008-01-24 10:59:40)  Magnetic Resonance Imaging Magnetic resonance imaging (MRI) is a painless test that takes pictures of the inside of your body. This test uses a strong magnet. This test does not use X-rays. What happens before the procedure?  You will be asked about any metal you may have in your body. This includes: ? Any joint replacement (prosthesis), such as an artificial knee or hip. ? Any implanted devices or ports. ? A metallic ear implant (cochlear implant). ? An artificial heart valve. ? A metallic object in the eye. ? Metal splinters. ? Bullet fragments. ? Any tattoos. Some of the darker inks can cause problems with testing.  You will be asked to take off all metal. This includes: ? Your watch, jewelry, and other metal objects. ? Hearing aids. ? Dentures. ? Underwire bra. ? Makeup. Some kinds of makeup contain small amounts of metal. ? Braces and fillings normally are not a problem.  If you are breastfeeding, ask your doctor if you need to pump before your test and then stop breastfeeding for a short time. You may need to do this if dye (contrast material) will be used during your MRI.  If you have a fear of cramped spaces (claustrophobia), you may be given medicines prior to the MRI. What happens during the procedure?   You may be given earplugs or headphones to listen to music. The MRI machine can be noisy.  You will lie down on a platform. It looks like a long table.  If dye will be used, an IV tube will be placed into one of your veins. Dye will be given through your IV tube at a certain time as  images are taken.  The platform will slide into a tunnel. The tunnel has magnets inside of it. When you are inside the tunnel, you will still be able to talk to your doctor.  The tunnel will scan your body and make images. You will be asked to lie very still. Your doctor will tell you when you can move. You may have to wait a few minutes to make sure that the images from the test are clear.  When all images are taken, the platform will slide out of the tunnel. The procedure may vary among doctors and hospitals. What happens after the procedure?  You may be taken to a recovery area if sedation medicines were used. Your blood pressure, heart rate, breathing rate, and blood oxygen  level will be monitored until you leave the hospital or clinic.  If dye was used: ? It will leave your body through your pee (urine). It takes about a day for all of the dye to leave the body. ? You may be told to drink plenty of fluids. This helps your body get rid of the dye. ? If you are breastfeeding, do not breastfeed your child until your doctor says that this is safe.  You may go back to your normal activities right away, or as told by your doctor.  It is up to you to get your test results. Ask your doctor, or the department that is doing the test, when your results will be ready. Summary  Magnetic resonance imaging (MRI) is a test that takes pictures of the inside of your body.  Before your MRI, be sure to tell your doctor about any metal you may have in your body.  You may go back to your normal activities right away, or as told by your doctor. This information is not intended to replace advice given to you by your health care provider. Make sure you discuss any questions you have with your health care provider. Document Released: 12/04/2010 Document Revised: 09/26/2017 Document Reviewed: 09/26/2017 Elsevier Patient Education  2020 [employer]  Lumbar Puncture A lumbar puncture, also called a spinal  tap, is a procedure that is done to remove a small amount of the fluid that surrounds the brain and spinal cord (cerebrospinal fluid, CSF). The fluid is then examined in the lab. This procedure may be done to:  Help diagnose various problems, such as meningitis, encephalitis, multiple sclerosis, and other infections.  Remove fluid and relieve pressure that occurs with certain types of headaches.  Look for bleeding within the brain and spinal cord areas (central nervous system).  Place medicine into the spinal fluid. Tell a health care provider about:  Any allergies you have.  All medicines you are taking, including vitamins, herbs, eye drops, creams, and over-the-counter medicines like aspirin or NSAIDs.  Any problems you or family members have had with anesthetic medicines.  Any blood disorders you have.  Any surgeries you have had.  Any medical conditions you have.  Whether you are pregnant or may be pregnant. What are the risks? Generally, this is a safe procedure. However, problems may occur, including:  Infection at the insertion site that can spread to the bone or spinal fluid.  Bleeding.  Spinal headache. This is a severe headache that occurs when there is a leak of spinal fluid.  Leakage of CSF.  Allergic reactions to medicines or dyes.  Damage to other structures or organs. What happens before the procedure? Staying hydrated Follow instructions from your health care provider about hydration, which may include:  Up to 2 hours before the procedure - you may continue to drink clear liquids, such as water, clear fruit juice, black coffee, and plain tea. Eating and drinking restrictions Follow instructions from your health care provider about eating and drinking. If you will be given a medicine to help you relax (sedative), these instructions may include:  8 hours before the procedure - stop eating heavy meals or foods such as meat, fried foods, or fatty foods.  6  hours before the procedure - stop eating light meals or foods, such as toast or cereal.  6 hours before the procedure - stop drinking milk or drinks that contain milk.  2 hours before the procedure - stop drinking clear liquids. Medicines  Ask your health care provider about: ? Changing or stopping your regular medicines. This is especially important if you are taking diabetes medicines or blood thinners. ? Taking medicines such as aspirin and ibuprofen. These medicines can thin your blood. Do not take these medicines before your procedure if your health care provider instructs you not to.  You may be given antibiotic medicine to help prevent infection. If so, take the antibiotic as told by your health care provider. General instructions  You may have a blood sample taken.  You may be asked to shower with a germ-killing soap.  Ask your health care provider how your surgical site will be marked or identified.  Plan to have someone take you home from the hospital or clinic. This is especially important if you will be given a sedative.  If you will be going home right after the procedure, plan to have someone with you for 24 hours. What happens during the procedure?   You may lie down on your side with your knees bent, or you may sit with your head resting on a pillow on your lap. ? How you are positioned depends on your age and size. ? You will be positioned so that the spaces between the bones of the spine (vertebrae) are as wide as possible. This will make it easier to pass the needle into the spinal canal.  The skin on your lower back (lumbar region) will be cleaned.  You will be given an injection of medicine to numb your lower back area (local anesthetic).  You may be given pain medicine or a sedative.  A small needle will be inserted into your lower back until it enters the space that contains the cerebrospinal fluid. The needle will not enter the spinal cord.  Fluid will be  collected into tubes. It will be sent to a lab for examination.  The needle will be withdrawn, and a bandage (dressing) will be placed over the area. The procedure may vary among health care providers and hospitals. What happens after the procedure?  Your blood pressure, heart rate, breathing rate, and blood oxygen level will be monitored until any medicines you were given have worn off.  You may need to stay lying down for a while.  You will need to drink plenty of fluids and caffeine to help prevent a headache.  Do not drive for 24 hours if you received a sedative. Summary  A lumbar puncture, also called a spinal tap, is a procedure that is done to remove a small amount of the cerebrospinal fluid, CSF. This may be done to help diagnose a wide variety of conditions.  Before the procedure, tell your health care provider about all medicines you are taking, including vitamins, herbs, eye drops, creams, and over-the-counter medicines.  Before the procedure, ask your health care provider about changing or stopping your regular medicines. This is especially important if you are taking blood thinners.  Your lower back will be numbed with an injection before the needle is placed into your spinal canal.  After the procedure, you will lie down for a while and you will drink plenty of fluids. This information is not intended to replace advice given to you by your health care provider. Make sure you discuss any questions you have with your health care provider. Document Released: 10/29/2000 Document Revised: 12/15/2016 Document Reviewed: 12/15/2016 Elsevier Patient Education  2020 [employer]

## 2019-06-18 NOTE — Progress Notes (Signed)
[facility]    Provider:  Dr Jaynee Eagles Requesting Provider: Danice Goltz* Primary Care Provider:  Patient, No Pcp Per  CC:  Eye swelling.   HPI:  Kimberly Villa is a 23 y.o. female here as requested by Danice Goltz* for optic nerve head edema. Here with her mother who provides much information. She went to an optometrist who sent her to an ophthalmologist and now neurology. Vision changes started a year ago, stable, left eye is worse, no headaches or floater, she has episodes of blurry vision, she has fullness in her ear bit attributed to sinuses. She has had headaches, more pressure, on the right side, light sensitivity, no nausea, no vomiting, last headache a month ago improved with new glasses. She has an IUD for 5 years, she has gained weight, lots of stress with graduation of school. No head trauma. No other focal neurologic deficits, associated symptoms, inciting events or modifiable factors.  Reviewed notes, labs and imaging from outside physicians, which showed:   I reviewed Dr. Trena Platt notes.  No recent head trauma.  No family history of sarcoidosis.  She wears an IUD.  No new headaches.  No diplopia.  No history of blood clots.  No vaping.  No passive smoke exposure.  There is a history of alcohol use and caffeine use.  OD 20/25 -2, OS 20/50 -2, intraocular pressures 19 OS and OD, examination reviewed and showed pupils equal round and reactive, no APD, open angles, extraocular movements full, external exam normal, slit-lamp unremarkable, funduscopic exam showed normal optic nerves, visual field was enlarged blind spot OS, OCT HRT interpretation was positive disc edema bilaterally.  Bilateral disc edema with possibly early visual field changes.  Suspect IIH.  Review of Systems: Patient complains of symptoms per HPI as well as the following symptoms: blurry vision . Pertinent negatives and positives per HPI. All others negative.   Social History    Socioeconomic History  . Marital status: Single    Spouse name: Not on file  . Number of children: 0  . Years of education: Not on file  . Highest education level: Bachelor's degree (e.g., BA, AB, BS)  Occupational History  . Not on file  Social Needs  . Financial resource strain: Not on file  . Food insecurity    Worry: Not on file    Inability: Not on file  . Transportation needs    Medical: Not on file    Non-medical: Not on file  Tobacco Use  . Smoking status: Never Smoker  . Smokeless tobacco: Never Used  Substance and Sexual Activity  . Alcohol use: Yes    Comment: wine occasionally  . Drug use: No  . Sexual activity: Yes    Birth control/protection: I.U.D.  Lifestyle  . Physical activity    Days per week: Not on file    Minutes per session: Not on file  . Stress: Not on file  Relationships  . Social Herbalist on phone: Not on file    Gets together: Not on file    Attends religious service: Not on file    Active member of club or organization: Not on file    Attends meetings of clubs or organizations: Not on file    Relationship status: Not on file  . Intimate partner violence    Fear of current or ex partner: Not on file    Emotionally abused: Not on file    Physically abused: Not on file  Forced sexual activity: Not on file  Other Topics Concern  . Not on file  Social History Narrative   Lives at home alone   Right handed   Caffeine: weekly, not daily    Family History  Problem Relation Age of Onset  . Multiple sclerosis Mother   . Diabetes Maternal Grandmother   . Pseudotumor cerebri Neg Hx     Past Medical History:  Diagnosis Date  . Asthma   . Obesity     Patient Active Problem List   Diagnosis Date Noted  . IIH (idiopathic intracranial hypertension) 06/18/2019  . Mild intermittent asthma without complication 05/17/2017  . Chronic allergic rhinitis 05/17/2017  . Chronic eczema 05/17/2017    Past Surgical History:   Procedure Laterality Date  . previous surgery     No previous surgery    Current Outpatient Medications  Medication Sig Dispense Refill  . albuterol (PROVENTIL HFA;VENTOLIN HFA) 108 (90 BASE) MCG/ACT inhaler Inhale into the lungs every 6 (six) hours as needed for wheezing or shortness of breath.    . beclomethasone (QVAR) 40 MCG/ACT inhaler Inhale into the lungs 2 (two) times daily.    . cetirizine (ZYRTEC) 10 MG tablet TAKE 1 TABLET(10 MG) BY MOUTH DAILY 30 tablet 0  . fluticasone (FLONASE) 50 MCG/ACT nasal spray Place 2 sprays into both nostrils daily. 16 g 6  . montelukast (SINGULAIR) 10 MG tablet Take 1 tablet (10 mg total) by mouth at bedtime. 90 tablet 11  . triamcinolone cream (KENALOG) 0.1 % Apply 1 application topically 2 (two) times daily. 30 g 1   No current facility-administered medications for this visit.     Allergies as of 06/18/2019 - Review Complete 06/18/2019  Allergen Reaction Noted  . Dust mite extract Other (See Comments) 04/24/2018    Vitals: BP (!) 150/90 (BP Location: Right Arm, Patient Position: Sitting)   Pulse (!) 116 Comment: pt states she's nervous  Temp 98.2 F (36.8 C) Comment: mother 97.3, both taken by front staff  Ht 5\' 3"  (1.6 m)   Wt 240 lb (108.9 kg)   BMI 42.51 kg/m  Last Weight:  Wt Readings from Last 1 Encounters:  06/18/19 240 lb (108.9 kg)   Last Height:   Ht Readings from Last 1 Encounters:  06/18/19 5\' 3"  (1.6 m)     Physical exam: Exam: Gen: NAD, conversant, well nourised, obese, well groomed                     CV: RRR, no MRG. No Carotid Bruits. No peripheral edema, warm, nontender Eyes: Conjunctivae clear without exudates or hemorrhage  Neuro: Detailed Neurologic Exam  Speech:    Speech is normal; fluent and spontaneous with normal comprehension.  Cognition:    The patient is oriented to person, place, and time;     recent and remote memory intact;     language fluent;     normal attention, concentration,      fund of knowledge Cranial Nerves:    The pupils are equal, round, and reactive to light. +1 papilledema bilat.  Visual fields are full to finger confrontation. Extraocular movements are intact. Trigeminal sensation is intact and the muscles of mastication are normal. The face is symmetric. The palate elevates in the midline. Hearing intact. Voice is normal. Shoulder shrug is normal. The tongue has normal motion without fasciculations.   Coordination:    Normal finger to nose and heel to shin. Normal rapid alternating movements.   Gait:  Heel-toe and tandem gait are normal.   Motor Observation:    No asymmetry, no atrophy, and no involuntary movements noted. Tone:    Normal muscle tone.    Posture:    Posture is normal. normal erect    Strength:    Strength is V/V in the upper and lower limbs.      Sensation: intact to LT     Reflex Exam:  DTR's: hypo right AJ otherwise deep tendon reflexes in the upper and lower extremities are normal bilaterally.   Toes:    The toes are downgoing bilaterally.   Clonus:    Clonus is absent.    Assessment/Plan:  23 year old patient  bilateral optic disc edema, episodes of vision changes/loss. Need MRI of the brain to evaluate for space occupying mass, chiari, intracranial hypertension or any other etiology.MRV of the brain for venous thrombosis given obesity, female age, birth control, papilledema. Will also order LP. If elevated opening pressure, will start diamox. If vision or an other symptoms changes acutely or anything concerning go to ED immediately. Likely IIH, discussed weight loss, risk of permanent vision loss.    -Remove IUD  - [facility] loss: [facility]  - MRI of the brain and blood Vessels  - Lumbar Puncture  - Will start Acetazolamide after workup (Diamox)  - f/u 4-6 weeks  Discussed: To prevent or relieve headaches, try the following: Cool Compress. Lie down and place a cool compress on  your head.  Avoid headache triggers. If certain foods or odors seem to have triggered your migraines in the past, avoid them. A headache diary might help you identify triggers.  Include physical activity in your daily routine. Try a daily walk or other moderate aerobic exercise.  Manage stress. Find healthy ways to cope with the stressors, such as delegating tasks on your to-do list.  Practice relaxation techniques. Try deep breathing, yoga, massage and visualization.  Eat regularly. Eating regularly scheduled meals and maintaining a healthy diet might help prevent headaches. Also, drink plenty of fluids.  Follow a regular sleep schedule. Sleep deprivation might contribute to headaches Consider biofeedback. With this mind-body technique, you learn to control certain bodily functions - such as muscle tension, heart rate and blood pressure - to prevent headaches or reduce headache pain.    Proceed to emergency room if you experience new or worsening symptoms or symptoms do not resolve, if you have new neurologic symptoms or if headache is severe, or for any concerning symptom.   Provided education and documentation from American headache Society toolbox including articles on: chronic migraine medication overuse headache, chronic migraines, prevention of migraines, behavioral and other nonpharmacologic treatments for headache.     Orders Placed This Encounter  Procedures  . MR BRAIN W WO CONTRAST  . MR MRV HEAD WO CM  . DG FLUORO GUIDED LOC OF NEEDLE/CATH TIP FOR SPINAL INJECT LT  . CBC with Differential/Platelets  . Comprehensive metabolic panel  . Hemoglobin A1c  . TSH  . Ambulatory referral to Family Practice     Cc: Elwin MochaShah, Christopher [name]*,    Naomie DeanAntonia Lakeem Rozo, MD  [facility] [address] [place], [place] [id number]  Phone [phone] Fax [phone]

## 2019-06-18 NOTE — Telephone Encounter (Signed)
[employer] order sent to [facility]. They will obtain the auth and reach out to the patient to schedule.  

## 2019-06-19 LAB — COMPREHENSIVE METABOLIC PANEL
ALT: 33 IU/L — ABNORMAL HIGH (ref 0–32)
AST: 19 IU/L (ref 0–40)
Albumin/Globulin Ratio: 1.4 (ref 1.2–2.2)
Albumin: 4.3 g/dL (ref 3.9–5.0)
Alkaline Phosphatase: 63 IU/L (ref 39–117)
BUN/Creatinine Ratio: 16 (ref 9–23)
BUN: 12 mg/dL (ref 6–20)
Bilirubin Total: <0.2 mg/dL (ref 0.0–1.2)
CO2: 20 mmol/L (ref 20–29)
Calcium: 9.6 mg/dL (ref 8.7–10.2)
Chloride: 102 mmol/L (ref 96–106)
Creatinine, Ser: 0.76 mg/dL (ref 0.57–1.00)
GFR calc Af Amer: 128 mL/min/{1.73_m2} (ref >59)
GFR calc non Af Amer: 111 mL/min/{1.73_m2} (ref >59)
Globulin, Total: 3 g/dL (ref 1.5–4.5)
Glucose: 101 mg/dL — ABNORMAL HIGH (ref 65–99)
Potassium: 5 mmol/L (ref 3.5–5.2)
Sodium: 135 mmol/L (ref 134–144)
Total Protein: 7.3 g/dL (ref 6.0–8.5)

## 2019-06-19 LAB — CBC WITH DIFFERENTIAL/PLATELET
Basophils Absolute: 0 10*3/uL (ref 0.0–0.2)
Basos: 1 %
EOS (ABSOLUTE): 0.5 10*3/uL — ABNORMAL HIGH (ref 0.0–0.4)
Eos: 7 %
Hematocrit: 38.9 % (ref 34.0–46.6)
Hemoglobin: 12 g/dL (ref 11.1–15.9)
Immature Grans (Abs): 0 10*3/uL (ref 0.0–0.1)
Immature Granulocytes: 0 %
Lymphocytes Absolute: 2.1 10*3/uL (ref 0.7–3.1)
Lymphs: 28 %
MCH: 23.8 pg — ABNORMAL LOW (ref 26.6–33.0)
MCHC: 30.8 g/dL — ABNORMAL LOW (ref 31.5–35.7)
MCV: 77 fL — ABNORMAL LOW (ref 79–97)
Monocytes Absolute: 0.4 10*3/uL (ref 0.1–0.9)
Monocytes: 5 %
Neutrophils Absolute: 4.5 10*3/uL (ref 1.4–7.0)
Neutrophils: 59 %
Platelets: 478 10*3/uL — ABNORMAL HIGH (ref 150–450)
RBC: 5.04 x10E6/uL (ref 3.77–5.28)
RDW: 14.9 % (ref 11.7–15.4)
WBC: 7.5 10*3/uL (ref 3.4–10.8)

## 2019-06-19 LAB — HEMOGLOBIN A1C
Est. average glucose Bld gHb Est-mCnc: 123 mg/dL
Hgb A1c MFr Bld: 5.9 % — ABNORMAL HIGH (ref 4.8–5.6)

## 2019-06-19 LAB — TSH: TSH: 1.35 u[IU]/mL (ref 0.450–4.500)

## 2019-06-21 ENCOUNTER — Telehealth: Payer: Self-pay | Admitting: *Deleted

## 2019-06-21 NOTE — Telephone Encounter (Signed)
I spoke with the patient and discussed her lab results. Pt understands that she needs to get established with a primary care doctor within the next 3-4 months to follow-up on the prediabetes so it does not progress to diabetes. She also understands the recommendation to start an OTC iron supplement. Pt will make an appt with PCP to address both of these issues. Her questions were answered. She verbalized appreciation.

## 2019-06-21 NOTE — Telephone Encounter (Signed)
-----   Message from Melvenia Beam, MD sent at 06/20/2019 10:42 AM EDT ----- Slight iron-deficiency anemia. Also she is pre-diabetic. Should follow up with primary care for treatment. She can start taking some daily iron supplements over the counter which will help with the mild anemia. However she really should get evaluated/treated for the pre-diabetes within 3-4 months so it does not progess to diabetes. She needs a primary care doctor.  thanks

## 2019-06-25 ENCOUNTER — Telehealth: Payer: Self-pay

## 2019-06-25 NOTE — Telephone Encounter (Signed)
Dr. Ouida Sills was wanting to discuss this patient with you. She didn't go into details. She stated that if you called her and she didn't answer that she would get a message and call back.

## 2019-06-26 NOTE — Telephone Encounter (Signed)
I spoke with Dr. Ouida Sills, she feels this patient is high risk for pregnancy and prefers not to remove the IUD, we discussed its always a risk-benefit decision and Im ok keeping the IUD in as long patient understands this has been implicated in [place] but of course unclear if this has anything to do with her optic nerve swelling, so we will continue with the workup, weight loss, treatment with Diamox. Dr. Ouida Sills will discuss with patient risks of removing vs keeping her IUD and we will continue workup.

## 2019-07-20 ENCOUNTER — Other Ambulatory Visit: Payer: Self-pay

## 2019-07-20 ENCOUNTER — Ambulatory Visit
Admission: RE | Admit: 2019-07-20 | Discharge: 2019-07-20 | Disposition: A | Discharge status: Home / Self Care | Payer: PRIVATE HEALTH INSURANCE | Source: Ambulatory Visit | Attending: Neurology | Admitting: Neurology

## 2019-07-20 DIAGNOSIS — G441 Vascular headache, not elsewhere classified: Secondary | ICD-10-CM

## 2019-07-20 DIAGNOSIS — G932 Benign intracranial hypertension: Secondary | ICD-10-CM

## 2019-07-20 DIAGNOSIS — H5462 Unqualified visual loss, left eye, normal vision right eye: Secondary | ICD-10-CM

## 2019-07-20 DIAGNOSIS — H4710 Unspecified papilledema: Secondary | ICD-10-CM

## 2019-07-20 DIAGNOSIS — H9190 Unspecified hearing loss, unspecified ear: Secondary | ICD-10-CM

## 2019-07-20 MED ORDER — GADOBENATE DIMEGLUMINE 529 MG/ML IV SOLN
20.0000 mL | Freq: Once | INTRAVENOUS | Status: AC | PRN
  Administered 2019-07-20: 20 mL via INTRAVENOUS

## 2019-07-24 ENCOUNTER — Ambulatory Visit
Admission: RE | Admit: 2019-07-24 | Discharge: 2019-07-24 | Disposition: A | Discharge status: Home / Self Care | Payer: PRIVATE HEALTH INSURANCE | Source: Ambulatory Visit | Attending: Neurology | Admitting: Neurology

## 2019-07-24 ENCOUNTER — Telehealth: Payer: Self-pay

## 2019-07-24 VITALS — BP 141/90 | HR 100

## 2019-07-24 DIAGNOSIS — H4710 Unspecified papilledema: Secondary | ICD-10-CM

## 2019-07-24 DIAGNOSIS — H9190 Unspecified hearing loss, unspecified ear: Secondary | ICD-10-CM

## 2019-07-24 DIAGNOSIS — H5462 Unqualified visual loss, left eye, normal vision right eye: Secondary | ICD-10-CM

## 2019-07-24 DIAGNOSIS — G441 Vascular headache, not elsewhere classified: Secondary | ICD-10-CM

## 2019-07-24 DIAGNOSIS — G932 Benign intracranial hypertension: Secondary | ICD-10-CM

## 2019-07-24 NOTE — Telephone Encounter (Signed)
Chevon from the [facility] called to let Dr. Jaynee Eagles know that the Gram stains done today showed WBC's. She says that it is questionable whether or not there is an organism so they cannot say at this point, but they have set it up for plate growth and will recheck after 16 hours to determine if an organism is seen or not. She says that she will call back at that time with final results. If you need to speak with her, her number is [phone].

## 2019-07-24 NOTE — Discharge Instructions (Signed)

## 2019-07-25 ENCOUNTER — Telehealth: Payer: Self-pay | Admitting: Neurology

## 2019-07-25 NOTE — Telephone Encounter (Addendum)
Kimberly Villa with [facility] called. Pt's 20 hour gram stain from thiobroth and on plate has showed no organisms, no growth to date.

## 2019-07-25 NOTE — Telephone Encounter (Signed)
Opening pressure significantly elevated (41) consistent with Idiopathic Intracranial Hypertension as discussed. I'd like to start her on Diamox 250mg  twice daily and increase from there if tolerated. Dispense 60 refill 3 and make sure she has follow up with my self and Dr. Manuella Ghazi thanks

## 2019-07-25 NOTE — Telephone Encounter (Signed)
[employer] auth: [id number] (exp. 07/18/19 to 10/16/19) patient had MRi's on 07/20/19 at [facility].

## 2019-07-26 MED ORDER — ACETAZOLAMIDE 250 MG PO TABS: 250.0000 mg | ORAL_TABLET | Freq: Two times a day (BID) | ORAL | 3 refills | Status: DC

## 2019-07-26 NOTE — Addendum Note (Signed)
Addended by: Gildardo Griffes on: 07/26/2019 11:50 AM   Modules accepted: Orders

## 2019-07-26 NOTE — Telephone Encounter (Addendum)
Spoke with Kimberly Villa. Advised per Dr. Jaynee Eagles, Kimberly Villa opening pressure on LP was significantly elevated at 41 which is consistent with IIH as discussed. I advised Dr. Jaynee Eagles would like Kimberly Villa to start on Diamox 250 mg twice daily and this can be increased by Dr. Jaynee Eagles if needed in the future if tolerated. I advised Kimberly Villa of common side effects including sodas tasting flat or funny, tingling in the fingers and toes, increase in urine output. I advised Kimberly Villa to call if she has any questions or concerns. I also advised Kimberly Villa to refer to AVS and additional med information that will come with the bottle. Kimberly Villa questions were answered. Kimberly Villa pharmacy was updated in the chart. She verbalized understanding and appreciation for the call. She was advised to be sure she has a follow up with Dr. Manuella Ghazi.  Kimberly Villa will see Dr. Jaynee Eagles again on 08/06/2019 at 7:30 AM.   Phone number confirmed with Kimberly Villa. Mychart signup info sent to Kimberly Villa's phone.   Acetazolamide 250 mg BID #60, refills 3 sent to [facility] per v.o. Dr. Jaynee Eagles.

## 2019-07-28 LAB — CSF CELL COUNT WITH DIFFERENTIAL
RBC Count, CSF: 0 cells/uL (ref 0–10)
WBC, CSF: 3 cells/uL (ref 0–5)

## 2019-07-28 LAB — CSF CULTURE W GRAM STAIN
MICRO NUMBER:: 855399
SPECIMEN QUALITY:: ADEQUATE

## 2019-07-28 LAB — CSF CULTURE

## 2019-07-28 LAB — GLUCOSE, CSF: Glucose, CSF: 58 mg/dL (ref 40–80)

## 2019-07-28 LAB — PROTEIN, CSF: Total Protein, CSF: 20 mg/dL (ref 15–45)

## 2019-07-30 ENCOUNTER — Ambulatory Visit: Payer: PRIVATE HEALTH INSURANCE | Admitting: Neurology

## 2019-08-06 ENCOUNTER — Ambulatory Visit: Payer: PRIVATE HEALTH INSURANCE | Admitting: Neurology

## 2019-08-06 ENCOUNTER — Ambulatory Visit: Payer: PRIVATE HEALTH INSURANCE | Admitting: Family Medicine

## 2019-08-06 ENCOUNTER — Encounter: Payer: Self-pay | Admitting: Family Medicine

## 2019-08-06 ENCOUNTER — Other Ambulatory Visit: Payer: Self-pay

## 2019-08-06 VITALS — BP 134/76 | HR 114 | Temp 97.7°F | Ht 63.0 in | Wt 237.4 lb

## 2019-08-06 DIAGNOSIS — G932 Benign intracranial hypertension: Secondary | ICD-10-CM

## 2019-08-06 MED ORDER — ACETAZOLAMIDE 250 MG PO TABS: 500.0000 mg | ORAL_TABLET | Freq: Two times a day (BID) | ORAL | 2 refills | Status: DC

## 2019-08-06 NOTE — Progress Notes (Signed)
PATIENT: Kimberly Villa DOB: Sep 19, 1996  REASON FOR VISIT: follow up HISTORY FROM: patient  Chief Complaint  Patient presents with  . IIH    Rm hall,  doing well.  No changes in vision.      HISTORY OF PRESENT ILLNESS: Today 08/06/19 Kimberly Villa is a 23 y.o. female here today for follow up of IIH. She reports that ophthalmology saw optic nerve edema and sent her for eval. She has not had much trouble with headaches. LP opening pressure of 41. MRI shows empty sella. She was started on Diamox 250mg  twice daily. She reports that she can see clearly out of the right eye. Her left eye is still blurry. She reports that optic pressures had improved. She is seeing PCP next week. She continues to have sinus congestion. She is taking antihistamine and Singulair daily. She denies fever, chills or sinus pain. [facility] but has not established an appt. She is working closely with GYN for consideration of continuing birth control (Mirena).   HISTORY: (copied from Dr Trevor Mace note on 06/23/2019)  HPI:  Kimberly Villa is a 23 y.o. female here as requested by Elwin Mocha* for optic nerve head edema. Here with her mother who provides much information. She went to an optometrist who sent her to an ophthalmologist and now neurology. Vision changes started a year ago, stable, left eye is worse, no headaches or floater, she has episodes of blurry vision, she has fullness in her ear bit attributed to sinuses. She has had headaches, more pressure, on the right side, light sensitivity, no nausea, no vomiting, last headache a month ago improved with new glasses. She has an IUD for 5 years, she has gained weight, lots of stress with graduation of school. No head trauma. No other focal neurologic deficits, associated symptoms, inciting events or modifiable factors.  Reviewed notes, labs and imaging from outside physicians, which showed:   I reviewed Dr. Margaretmary Eddy  notes.  No recent head trauma.  No family history of sarcoidosis.  She wears an IUD.  No new headaches.  No diplopia.  No history of blood clots.  No vaping.  No passive smoke exposure.  There is a history of alcohol use and caffeine use.  OD 20/25 -2, OS 20/50 -2, intraocular pressures 19 OS and OD, examination reviewed and showed pupils equal round and reactive, no APD, open angles, extraocular movements full, external exam normal, slit-lamp unremarkable, funduscopic exam showed normal optic nerves, visual field was enlarged blind spot OS, OCT HRT interpretation was positive disc edema bilaterally.  Bilateral disc edema with possibly early visual field changes.  Suspect IIH.   REVIEW OF SYSTEMS: Out of a complete 14 system review of symptoms, the patient complains only of the following symptoms, sinus congestion, allergies and all other reviewed systems are negative.  ALLERGIES: Allergies  Allergen Reactions  . Dust Mite Extract Other (See Comments)    Makes allergies flare up Makes allergies flare up     HOME MEDICATIONS: Outpatient Medications Prior to Visit  Medication Sig Dispense Refill  . albuterol (PROVENTIL HFA;VENTOLIN HFA) 108 (90 BASE) MCG/ACT inhaler Inhale into the lungs every 6 (six) hours as needed for wheezing or shortness of breath.    . beclomethasone (QVAR) 40 MCG/ACT inhaler Inhale into the lungs 2 (two) times daily.    . cetirizine (ZYRTEC) 10 MG tablet TAKE 1 TABLET(10 MG) BY MOUTH DAILY 30 tablet 0  . fluticasone (FLONASE) 50 MCG/ACT  nasal spray Place 2 sprays into both nostrils daily. 16 g 6  . montelukast (SINGULAIR) 10 MG tablet Take 1 tablet (10 mg total) by mouth at bedtime. 90 tablet 11  . triamcinolone cream (KENALOG) 0.1 % Apply 1 application topically 2 (two) times daily. 30 g 1  . acetaZOLAMIDE (DIAMOX) 250 MG tablet Take 1 tablet (250 mg total) by mouth 2 (two) times daily. 60 tablet 3   No facility-administered medications prior to visit.     PAST  MEDICAL HISTORY: Past Medical History:  Diagnosis Date  . Asthma   . Obesity     PAST SURGICAL HISTORY: Past Surgical History:  Procedure Laterality Date  . previous surgery     No previous surgery    FAMILY HISTORY: Family History  Problem Relation Age of Onset  . Multiple sclerosis Mother   . Diabetes Maternal Grandmother   . Pseudotumor cerebri Neg Hx   . Migraines Neg Hx     SOCIAL HISTORY: Social History   Socioeconomic History  . Marital status: Single    Spouse name: Not on file  . Number of children: 0  . Years of education: Not on file  . Highest education level: Bachelor's degree (e.g., BA, AB, BS)  Occupational History  . Not on file  Social Needs  . Financial resource strain: Not on file  . Food insecurity    Worry: Not on file    Inability: Not on file  . Transportation needs    Medical: Not on file    Non-medical: Not on file  Tobacco Use  . Smoking status: Never Smoker  . Smokeless tobacco: Never Used  Substance and Sexual Activity  . Alcohol use: Yes    Comment: wine occasionally  . Drug use: No  . Sexual activity: Yes    Birth control/protection: I.U.D.  Lifestyle  . Physical activity    Days per week: Not on file    Minutes per session: Not on file  . Stress: Not on file  Relationships  . Social Herbalist on phone: Not on file    Gets together: Not on file    Attends religious service: Not on file    Active member of club or organization: Not on file    Attends meetings of clubs or organizations: Not on file    Relationship status: Not on file  . Intimate partner violence    Fear of current or ex partner: Not on file    Emotionally abused: Not on file    Physically abused: Not on file    Forced sexual activity: Not on file  Other Topics Concern  . Not on file  Social History Narrative   Lives at home alone   Right handed   Caffeine: weekly, not daily      PHYSICAL EXAM  Vitals:   08/06/19 1045  BP: 134/76   Pulse: (!) 114  Temp: 97.7 F (36.5 C)  SpO2: 98%  Weight: 237 lb 6.4 oz (107.7 kg)  Height: 5\' 3"  (1.6 m)   Body mass index is 42.05 kg/m.  Generalized: Well developed, in no acute distress  Cardiology: normal rate and rhythm, no murmur noted Neurological examination  Mentation: Alert oriented to time, place, history taking. Follows all commands speech and language fluent Cranial nerve II-XII: Pupils were equal round reactive to light. Extraocular movements were full, visual field were full on confrontational test. Facial sensation and strength were normal. Uvula tongue midline. Head turning and  shoulder shrug  were normal and symmetric. Motor: The motor testing reveals 5 over 5 strength of all 4 extremities. Good symmetric motor tone is noted throughout.  Sensory: Sensory testing is intact to soft touch on all 4 extremities. No evidence of extinction is noted.  Coordination: Cerebellar testing reveals good finger-nose-finger and heel-to-shin bilaterally.  Gait and station: Gait is normal.    DIAGNOSTIC DATA (LABS, IMAGING, TESTING) - I reviewed patient records, labs, notes, testing and imaging myself where available.  No flowsheet data found.   Lab Results  Component Value Date   WBC 7.5 06/18/2019   HGB 12.0 06/18/2019   HCT 38.9 06/18/2019   MCV 77 (L) 06/18/2019   PLT 478 (H) 06/18/2019      Component Value Date/Time   NA 135 06/18/2019 0947   K 5.0 06/18/2019 0947   CL 102 06/18/2019 0947   CO2 20 06/18/2019 0947   GLUCOSE 101 (H) 06/18/2019 0947   BUN 12 06/18/2019 0947   CREATININE 0.76 06/18/2019 0947   CALCIUM 9.6 06/18/2019 0947   PROT 7.3 06/18/2019 0947   ALBUMIN 4.3 06/18/2019 0947   AST 19 06/18/2019 0947   ALT 33 (H) 06/18/2019 0947   ALKPHOS 63 06/18/2019 0947   BILITOT <0.2 06/18/2019 0947   GFRNONAA 111 06/18/2019 0947   GFRAA 128 06/18/2019 0947   No results found for: CHOL, HDL, LDLCALC, LDLDIRECT, TRIG, CHOLHDL Lab Results  Component Value  Date   HGBA1C 5.9 (H) 06/18/2019   No results found for: [id number] Lab Results  Component Value Date   TSH 1.350 06/18/2019     ASSESSMENT AND PLAN 23 y.o. year old female  has a past medical history of Asthma and Obesity. here with     ICD-10-CM   1. IIH (idiopathic intracranial hypertension)  G93.2     Ms. Common is doing well today.  She is tolerating Diamox 250 mg twice daily.  We will increase dose to 500 mg twice daily.  She will follow-up with primary care closely for consideration of treatment/referral for chronic sinusitis.  She may try Mucinex over-the-counter as directed by packaging for sinus congestion.  She will also follow-up closely with ophthalmology for continued eye exams.  I have encouraged her to work on a healthy lifestyle and consider establishing care with a [facility].  She will follow-up with me in 3 months, sooner if needed.  She verbalizes understanding and agreement with this plan.   No orders of the defined types were placed in this encounter.    Meds ordered this encounter  Medications  . acetaZOLAMIDE (DIAMOX) 250 MG tablet    Sig: Take 2 tablets (500 mg total) by mouth 2 (two) times daily.    Dispense:  120 tablet    Refill:  2    Order Specific Question:   Supervising Provider    Answer:   Anson Fret [id number]      I spent 15 minutes with the patient. 50% of this time was spent counseling and educating patient on plan of care and medications.    Shawnie Dapper, FNP-C 08/06/2019, 12:46 PM [facility] [address] [place], [place] [address] [phone]

## 2019-08-06 NOTE — Patient Instructions (Addendum)
  Increase Diamox to 500mg  twice daily  Continue follow up with ophthalmology  Follow up with PCP for concerns of chronic sinusitis.   Follow up with me in 3 months   Idiopathic Intracranial Hypertension  Idiopathic intracranial hypertension (IIH) is a condition that increases pressure around the brain. The fluid that surrounds the brain and spinal cord (cerebrospinal fluid, CSF) increases and causes the pressure. Idiopathic means that the cause of this condition is not known. IIH affects the brain and spinal cord (is a neurological disorder). If this condition is not treated, it can cause vision loss or blindness. What increases the risk? You are more likely to develop this condition if:  You are severely overweight (obese).  You are a woman who has not gone through menopause.  You take certain medicines, such as birth control or steroids. What are the signs or symptoms? Symptoms of IIH include:  Headaches. This is the most common symptom.  Pain in the shoulders or neck.  Nausea and vomiting.  A "rushing water" or pulsing sound within the ears (pulsatile tinnitus).  Double vision.  Blurred vision.  Brief episodes of complete vision loss. How is this diagnosed? This condition may be diagnosed based on:  Your symptoms.  Your medical history.  CT scan of the brain.  MRI of the brain.  Magnetic resonance venogram (MRV) to check veins in the brain.  Diagnostic lumbar puncture. This is a procedure to remove and examine a sample of cerebrospinal fluid. This procedure can determine whether too much fluid may be causing IIH.  A thorough eye exam to check for swelling or nerve damage in the eyes. How is this treated? Treatment for this condition depends on your symptoms. The goal of treatment is to decrease the pressure around your brain. Common treatments include:  Medicines to decrease the production of spinal fluid and lower the pressure within your skull.   Medicines to prevent or treat headaches.  Surgery to place drains (shunts) in your brain to remove excess fluid.  Lumbar puncture to remove excess cerebrospinal fluid. Follow these instructions at home:  If you are overweight or obese, work with your health care provider to lose weight.  Take over-the-counter and prescription medicines only as told by your health care provider.  Do not drive or use heavy machinery while taking medicines that can make you sleepy.  Keep all follow-up visits as told by your health care provider. This is important. Contact a health care provider if:  You have changes in your vision, such as: ? Double vision. ? Not being able to see colors (color vision). Get help right away if:  You have any of the following symptoms and they get worse or do not get better. ? Headaches. ? Nausea. ? Vomiting. ? Vision changes or difficulty seeing. Summary  Idiopathic intracranial hypertension (IIH) is a condition that increases pressure around the brain. The cause is not known (is idiopathic).  The most common symptom of IIH is headaches.  Treatment may include medicines or surgery to relieve the pressure on your brain. This information is not intended to replace advice given to you by your health care provider. Make sure you discuss any questions you have with your health care provider. Document Released: 01/10/2002 Document Revised: 10/14/2017 Document Reviewed: 09/22/2016 Elsevier Patient Education  2020 [employer].

## 2019-08-07 NOTE — Progress Notes (Signed)
Fax confirmation received for [facility] [address], Dr. Noel Christmas, [phone] fax. ,  [phone].

## 2019-08-08 ENCOUNTER — Telehealth: Payer: Self-pay | Admitting: *Deleted

## 2019-08-08 NOTE — Telephone Encounter (Signed)
Received last eye exam for pt from Dr. Manuella Ghazi, [facility].  fx [phone].

## 2019-08-09 NOTE — Telephone Encounter (Signed)
Reviewed. Edema improving. Increased dose of Diamox to 500mg  twice daily. Will follow closely. Report returned to medical records.

## 2019-08-21 ENCOUNTER — Telehealth: Payer: Self-pay | Admitting: Family Medicine

## 2019-08-21 NOTE — Telephone Encounter (Signed)
I called pt.  She stated she was having problems getting her prescription of new dose from the pharmacy.  I called [facility] and they stated they wanted her to use all of the bottle she had first and then would get new prescription ready.  They calculated that she was able to pick up today.  I relayed to pt.  She verbalized understanding.  Dose 500mg  po bid.  (250mg  tablets).

## 2019-08-21 NOTE — Telephone Encounter (Signed)
Phone rep checked office voicemail, pt called stating she would like a call back to discuss a medication that did not get refilled (pt did not state on voicemail the name of the medication) this voicemail was left @4 :06 pm on 08-20-19

## 2019-09-17 ENCOUNTER — Other Ambulatory Visit: Payer: Self-pay

## 2019-09-17 ENCOUNTER — Encounter: Payer: Self-pay | Admitting: Family Medicine

## 2019-09-17 ENCOUNTER — Ambulatory Visit: Payer: PRIVATE HEALTH INSURANCE | Admitting: Family Medicine

## 2019-09-17 VITALS — BP 120/82 | HR 99 | Temp 97.7°F | Ht 63.0 in | Wt 238.5 lb

## 2019-09-17 DIAGNOSIS — R7303 Prediabetes: Secondary | ICD-10-CM

## 2019-09-17 DIAGNOSIS — Z23 Encounter for immunization: Secondary | ICD-10-CM

## 2019-09-17 DIAGNOSIS — J302 Other seasonal allergic rhinitis: Secondary | ICD-10-CM | POA: Diagnosis not present

## 2019-09-17 DIAGNOSIS — R7401 Elevation of levels of liver transaminase levels: Secondary | ICD-10-CM

## 2019-09-17 DIAGNOSIS — R718 Other abnormality of red blood cells: Secondary | ICD-10-CM

## 2019-09-17 MED ORDER — BECLOMETHASONE DIPROPIONATE 40 MCG/ACT IN AERS: 2.0000 | INHALATION_SPRAY | Freq: Two times a day (BID) | RESPIRATORY_TRACT | 3 refills | Status: DC

## 2019-09-17 MED ORDER — ALBUTEROL SULFATE HFA 108 (90 BASE) MCG/ACT IN AERS: 1.0000 | INHALATION_SPRAY | Freq: Four times a day (QID) | RESPIRATORY_TRACT | 3 refills | Status: AC | PRN

## 2019-09-17 MED ORDER — MOMETASONE FUROATE 50 MCG/ACT NA SUSP: 2.0000 | Freq: Every day | NASAL | 12 refills | Status: DC

## 2019-09-17 NOTE — Patient Instructions (Addendum)
Give [place] 2-3 business days to get the results of your labs back.   Keep the diet clean and stay active.  Aim to do some physical exertion for 150 minutes per week. This is typically divided into 5 days per week, 30 minutes per day. The activity should be enough to get your heart rate up. Anything is better than nothing if you have time constraints.  If you do not hear anything about your referrals in the next 1-2 weeks, call our office and ask for an update.  Healthy Eating Plan Many factors influence your heart health, including eating and exercise habits. Heart (coronary) risk increases with abnormal blood fat (lipid) levels. Heart-healthy meal planning includes limiting unhealthy fats, increasing healthy fats, and making other small dietary changes. This includes maintaining a healthy body weight to help keep lipid levels within a normal range.  WHAT IS MY PLAN?  Your health care provider recommends that you:  Drink a glass of water before meals to help with satiety.  Eat slowly.  An alternative to the water is to add Metamucil. This will help with satiety as well. It does contain calories, unlike water.  WHAT TYPES OF FAT SHOULD I CHOOSE?  Choose healthy fats more often. Choose monounsaturated and polyunsaturated fats, such as olive oil and canola oil, flaxseeds, walnuts, almonds, and seeds.  Eat more omega-3 fats. Good choices include salmon, mackerel, sardines, tuna, flaxseed oil, and ground flaxseeds. Aim to eat fish at least two times each week.  Avoid foods with partially hydrogenated oils in them. These contain trans fats. Examples of foods that contain trans fats are stick margarine, some tub margarines, cookies, crackers, and other baked goods. If you are going to avoid a fat, this is the one to avoid!  WHAT GENERAL GUIDELINES DO I NEED TO FOLLOW?  Check food labels carefully to identify foods with trans fats. Avoid these types of options when possible.  Fill one half of  your plate with vegetables and green salads. Eat 4-5 servings of vegetables per day. A serving of vegetables equals 1 cup of raw leafy vegetables,  cup of raw or cooked cut-up vegetables, or  cup of vegetable juice.  Fill one fourth of your plate with whole grains. Look for the word "whole" as the first word in the ingredient list.  Fill one fourth of your plate with lean protein foods.  Eat 4-5 servings of fruit per day. A serving of fruit equals one medium whole fruit,  cup of dried fruit,  cup of fresh, frozen, or canned fruit. Try to avoid fruits in cups/syrups as the sugar content can be high.  Eat more foods that contain soluble fiber. Examples of foods that contain this type of fiber are apples, broccoli, carrots, beans, peas, and barley. Aim to get 20-30 g of fiber per day.  Eat more home-cooked food and less restaurant, buffet, and fast food.  Limit or avoid alcohol.  Limit foods that are high in starch and sugar.  Avoid fried foods when able.  Cook foods by using methods other than frying. Baking, boiling, grilling, and broiling are all great options. Other fat-reducing suggestions include: ? Removing the skin from poultry. ? Removing all visible fats from meats. ? Skimming the fat off of stews, soups, and gravies before serving them. ? Steaming vegetables in water or broth.  Lose weight if you are overweight. Losing just 5-10% of your initial body weight can help your overall health and prevent diseases such as diabetes  and heart disease.  Increase your consumption of nuts, legumes, and seeds to 4-5 servings per week. One serving of dried beans or legumes equals  cup after being cooked, one serving of nuts equals 1 ounces, and one serving of seeds equals  ounce or 1 tablespoon.  WHAT ARE GOOD FOODS CAN I EAT? Grains Grainy breads (try to find bread that is 3 g of fiber per slice or greater), oatmeal, light popcorn. Whole-grain cereals. Rice and pasta, including brown  rice and those that are made with whole wheat. Edamame pasta is a great alternative to grain pasta. It has a higher protein content. Try to avoid significant consumption of white bread, sugary cereals, or pastries/baked goods.  Vegetables All vegetables. Cooked white potatoes do not count as vegetables.  Fruits All fruits, but limit pineapple and bananas as these fruits have a higher sugar content.  Meats and Other Protein Sources Lean, well-trimmed beef, veal, pork, and lamb. Chicken and [place] without skin. All fish and shellfish. Wild duck, rabbit, pheasant, and venison. Egg whites or low-cholesterol egg substitutes. Dried beans, peas, lentils, and tofu.Seeds and most nuts.  Dairy Low-fat or nonfat cheeses, including ricotta, string, and mozzarella. Skim or 1% milk that is liquid, powdered, or evaporated. Buttermilk that is made with low-fat milk. Nonfat or low-fat yogurt. Soy/Almond milk are good alternatives if you cannot handle dairy.  Beverages Water is the best for you. Sports drinks with less sugar are more desirable unless you are a highly active athlete.  Sweets and Desserts Sherbets and fruit ices. Honey, jam, marmalade, jelly, and syrups. Dark chocolate.  Eat all sweets and desserts in moderation.  Fats and Oils Nonhydrogenated (trans-free) margarines. Vegetable oils, including soybean, sesame, sunflower, olive, peanut, safflower, corn, canola, and cottonseed. Salad dressings or mayonnaise that are made with a vegetable oil. Limit added fats and oils that you use for cooking, baking, salads, and as spreads.  Other Cocoa powder. Coffee and tea. Most condiments.  The items listed above may not be a complete list of recommended foods or beverages. Contact your dietitian for more options.

## 2019-09-17 NOTE — Progress Notes (Signed)
Chief Complaint  Patient presents with  . New Patient (Initial Visit)       New Patient Visit SUBJECTIVE: HPI: Kimberly Villa is an 23 y.o.female who is being seen for establishing care.  Patient has a history of prediabetes.  She was taking metformin from her previous PCP but is taking it intermittently at this point.  Diet could be better and she is working hard to lose weight.  She was set up to see a nutritionist but this fell through.  She is not doing much for exercise right now.  She does do a lot of walking in her job.  Normal urination and defecation.  Patient has a history of idiopathic intracranial hypertension.  She follows with the neurology team for this.  She is currently on Diamox which is controlling her symptoms.  The patient has a history of seasonal allergies.  She has ear pain/fullness bilaterally.  She is taking Zyrtec which seems to help a little bit.  She did not do well with Flonase and is not taking anything else at this time.  She has never tried Nasonex or Rhinocort.  She also has a history of asthma.  She has albuterol and is also taking Qvar daily.  While she is not rinsing her mouth out, she is not having any oral lesions.  Patient has a history of "iron deficiency".  She has never had her iron levels checked.  She is not taking them routinely.  Cycles are not particularly heavy.  Allergies  Allergen Reactions  . Dust Mite Extract Other (See Comments)    Makes allergies flare up Makes allergies flare up     Past Medical History:  Diagnosis Date  . Asthma   . IIH (idiopathic intracranial hypertension)    Past Surgical History:  Procedure Laterality Date  . previous surgery     No previous surgery   Family History  Problem Relation Age of Onset  . Multiple sclerosis Mother   . Diabetes Maternal Grandmother   . Pseudotumor cerebri Neg Hx   . Migraines Neg Hx    Allergies  Allergen Reactions  . Dust Mite Extract Other (See Comments)    Makes  allergies flare up Makes allergies flare up     Current Outpatient Medications:  .  acetaZOLAMIDE (DIAMOX) 250 MG tablet, Take 2 tablets (500 mg total) by mouth 2 (two) times daily., Disp: 120 tablet, Rfl: 2 .  albuterol (VENTOLIN HFA) 108 (90 Base) MCG/ACT inhaler, Inhale 1-2 puffs into the lungs every 6 (six) hours as needed for wheezing or shortness of breath., Disp: 18 g, Rfl: 3 .  beclomethasone (QVAR) 40 MCG/ACT inhaler, Inhale 2 puffs into the lungs 2 (two) times daily., Disp: 1 Inhaler, Rfl: 3 .  cetirizine (ZYRTEC) 10 MG tablet, TAKE 1 TABLET(10 MG) BY MOUTH DAILY, Disp: 30 tablet, Rfl: 0 .  mometasone (NASONEX) 50 MCG/ACT nasal spray, Place 2 sprays into the nose daily., Disp: 17 g, Rfl: 12  No LMP recorded. (Menstrual status: IUD).  ROS Constitutional: No fevers Endo: No weight loss Cardiac: No chest pain Lungs: No shortness of breath Abdomen: No nausea GU: No polyuria Skin: No thrush Eyes: No vision changes Ears: +fullness Neuro: No lightheadedness  OBJECTIVE: BP 120/82 (BP Location: Left Arm, Patient Position: Sitting, Cuff Size: Large)   Pulse 99   Temp 97.7 F (36.5 C) (Temporal)   Ht 5\' 3"  (1.6 m)   Wt 238 lb 8 oz (108.2 kg)   SpO2 98%  BMI 42.25 kg/m   Constitutional: -  VS reviewed -  Well developed, well nourished, appears stated age -  No apparent distress  Psychiatric: -  Oriented to person, place, and time -  Memory intact -  Affect and mood normal -  Fluent conversation, good eye contact -  Judgment and insight age appropriate  Eye: -  Conjunctivae clear, no discharge -  Pupils symmetric, round, reactive to light  ENMT: -  MMM    Pharynx moist, no exudate, no erythema  Neck: -  No gross swelling, no palpable masses -  Thyroid midline, not enlarged, mobile, no palpable masses  Cardiovascular: -  RRR -  No LE edema  Respiratory: -  Normal respiratory effort, no accessory muscle use, no retraction -  Breath sounds equal, no wheezes, no  ronchi, no crackles  Gastrointestinal: -  Bowel sounds normal -  No tenderness, no distention, no guarding, no masses  Neurological:  -  CN II - XII grossly intact -  Sensation grossly intact to light touch, equal bilaterally  Musculoskeletal: -  No clubbing, no cyanosis -  Gait normal  Skin: -  No significant lesion on inspection -  Warm and dry to palpation   ASSESSMENT/PLAN: Seasonal allergies - Plan: Ambulatory referral to Allergy  Microcytosis - Plan: Ferritin, Iron, Iron Binding Cap (TIBC)([facility])  Prediabetes - Plan: Hemoglobin A1c  Elevated ALT measurement - Plan: Comprehensive metabolic panel  Morbid obesity ([place]) - Plan: Amb Ref to Medical Weight Management  Patient instructed to sign release of records form from her previous PCP. Ck labs. Refer to allergy team. Start another INCS. Counseled on diet and exercise. See MWM.  Follow up on ALT. Patient should return in 3 mo for CPE. The patient voiced understanding and agreement to the plan.   [place], DO 09/17/19  4:58 PM

## 2019-09-18 LAB — COMPREHENSIVE METABOLIC PANEL
ALT: 29 IU/L (ref 0–32)
AST: 18 IU/L (ref 0–40)
Albumin/Globulin Ratio: 1.6 (ref 1.2–2.2)
Albumin: 4.1 g/dL (ref 3.9–5.0)
Alkaline Phosphatase: 63 IU/L (ref 39–117)
BUN/Creatinine Ratio: 17 (ref 9–23)
BUN: 13 mg/dL (ref 6–20)
Bilirubin Total: 0.2 mg/dL (ref 0.0–1.2)
CO2: 20 mmol/L (ref 20–29)
Calcium: 9.3 mg/dL (ref 8.7–10.2)
Chloride: 107 mmol/L — ABNORMAL HIGH (ref 96–106)
Creatinine, Ser: 0.75 mg/dL (ref 0.57–1.00)
GFR calc Af Amer: 129 mL/min/{1.73_m2} (ref >59)
GFR calc non Af Amer: 112 mL/min/{1.73_m2} (ref >59)
Globulin, Total: 2.6 g/dL (ref 1.5–4.5)
Glucose: 88 mg/dL (ref 65–99)
Potassium: 4.6 mmol/L (ref 3.5–5.2)
Sodium: 140 mmol/L (ref 134–144)
Total Protein: 6.7 g/dL (ref 6.0–8.5)

## 2019-09-18 LAB — HEMOGLOBIN A1C
Est. average glucose Bld gHb Est-mCnc: 128 mg/dL
Hgb A1c MFr Bld: 6.1 % — ABNORMAL HIGH (ref 4.8–5.6)

## 2019-09-18 LAB — IRON AND TIBC
Iron Saturation: 14 % — ABNORMAL LOW (ref 15–55)
Iron: 32 ug/dL (ref 27–159)
Total Iron Binding Capacity: 230 ug/dL — ABNORMAL LOW (ref 250–450)
UIBC: 198 ug/dL (ref 131–425)

## 2019-09-18 LAB — FERRITIN: Ferritin: 263 ng/mL — ABNORMAL HIGH (ref 15–150)

## 2019-10-15 ENCOUNTER — Ambulatory Visit: Payer: PRIVATE HEALTH INSURANCE | Admitting: Allergy

## 2019-10-15 NOTE — Progress Notes (Deleted)
New Patient Note  RE: Kimberly Chancellorekeyla Prime MRN: [id number] DOB: 10/02/1996 Date of Office Visit: 10/15/2019  Referring provider: Sharlene DoryWendling, Nicholas Paul* Primary care provider: Sharlene DoryWendling, Nicholas Paul, DO  Chief Complaint: No chief complaint on file.  History of Present Illness: I had the pleasure of seeing Kimberly Villa for initial evaluation at the [facility] on 10/15/2019. She is a 23 y.o. female, who is referred here by Sharlene DoryWendling, Nicholas Paul, DO for the evaluation of allergies.   Rhinitis: She reports symptoms of ***. Symptoms have been going on for *** years. The symptoms are present *** all year around with worsening in ***. Other triggers include exposure to ***. Anosmia: ***. Headache: ***. She has used *** with ***fair improvement in symptoms. Sinus infections: ***. Previous work up includes: ***. Previous ENT evaluation: ***. Previous sinus imaging: ***. History of nasal polyps: ***. Last eye exam: ***. History of reflux: ***.  The patient has a history of seasonal allergies.  She has ear pain/fullness bilaterally.  She is taking Zyrtec which seems to help a little bit.  She did not do well with Flonase and is not taking anything else at this time.  She has never tried Nasonex or Rhinocort.  She also has a history of asthma.  She has albuterol and is also taking Qvar daily.  While she is not rinsing her mouth out, she is not having any oral lesions.  Assessment and Plan: Kimberly Villa is a 23 y.o. female with: No problem-specific Assessment & Plan notes found for this encounter.  No follow-ups on file.  No orders of the defined types were placed in this encounter.  Lab Orders  No laboratory test(s) ordered today    Other allergy screening: Asthma: {Blank single:19197::"yes","no"} Rhino conjunctivitis: {Blank single:19197::"yes","no"} Food allergy: {Blank single:19197::"yes","no"} Medication allergy: {Blank single:19197::"yes","no"} Hymenoptera  allergy: {Blank single:19197::"yes","no"} Urticaria: {Blank single:19197::"yes","no"} Eczema:{Blank single:19197::"yes","no"} History of recurrent infections suggestive of immunodeficency: {Blank single:19197::"yes","no"}  Diagnostics: Spirometry:  Tracings reviewed. Her effort: {Blank single:19197::"Good reproducible efforts.","It was hard to get consistent efforts and there is a question as to whether this reflects a maximal maneuver.","Poor effort, data can not be interpreted."} FVC: ***L FEV1: ***L, ***% predicted FEV1/FVC ratio: ***% Interpretation: {Blank single:19197::"Spirometry consistent with mild obstructive disease","Spirometry consistent with moderate obstructive disease","Spirometry consistent with severe obstructive disease","Spirometry consistent with possible restrictive disease","Spirometry consistent with mixed obstructive and restrictive disease","Spirometry uninterpretable due to technique","Spirometry consistent with normal pattern","No overt abnormalities noted given today's efforts"}.  Please see scanned spirometry results for details.  Skin Testing: {Blank single:19197::"Select foods","Environmental allergy panel","Environmental allergy panel and select foods","Food allergy panel","None","Deferred due to recent antihistamines use"}. Positive test to: ***. Negative test to: ***.  Results discussed with patient/family.   Past Medical History: Patient Active Problem List   Diagnosis Date Noted  . Seasonal allergies 09/17/2019  . Prediabetes 09/17/2019  . Morbid obesity (H[facility]) 09/17/2019  . IIH (idiopathic intracranial hypertension) 06/18/2019  . Mild intermittent asthma without complication 05/17/2017  . Chronic allergic rhinitis 05/17/2017  . Chronic eczema 05/17/2017   Past Medical History:  Diagnosis Date  . Asthma   . IIH (idiopathic intracranial hypertension)    Past Surgical History: Past Surgical History:  Procedure Laterality Date  . previous surgery      No previous surgery   Medication List:  Current Outpatient Medications  Medication Sig Dispense Refill  . acetaZOLAMIDE (DIAMOX) 250 MG tablet Take 2 tablets (500 mg total) by mouth 2 (two) times daily. 120 tablet 2  . albuterol (  VENTOLIN HFA) 108 (90 Base) MCG/ACT inhaler Inhale 1-2 puffs into the lungs every 6 (six) hours as needed for wheezing or shortness of breath. 18 g 3  . beclomethasone (QVAR) 40 MCG/ACT inhaler Inhale 2 puffs into the lungs 2 (two) times daily. 1 Inhaler 3  . cetirizine (ZYRTEC) 10 MG tablet TAKE 1 TABLET(10 MG) BY MOUTH DAILY 30 tablet 0  . mometasone (NASONEX) 50 MCG/ACT nasal spray Place 2 sprays into the nose daily. 17 g 12   No current facility-administered medications for this visit.    Allergies: Allergies  Allergen Reactions  . Dust Mite Extract Other (See Comments)    Makes allergies flare up Makes allergies flare up    Social History: Social History   Socioeconomic History  . Marital status: Single    Spouse name: Not on file  . Number of children: 0  . Years of education: Not on file  . Highest education level: Bachelor's degree (e.g., BA, AB, BS)  Occupational History  . Not on file  Social Needs  . Financial resource strain: Not on file  . Food insecurity    Worry: Not on file    Inability: Not on file  . Transportation needs    Medical: Not on file    Non-medical: Not on file  Tobacco Use  . Smoking status: Never Smoker  . Smokeless tobacco: Never Used  Substance and Sexual Activity  . Alcohol use: Yes    Comment: wine occasionally  . Drug use: No  . Sexual activity: Yes    Birth control/protection: I.U.D.  Lifestyle  . Physical activity    Days per week: Not on file    Minutes per session: Not on file  . Stress: Not on file  Relationships  . Social Musician on phone: Not on file    Gets together: Not on file    Attends religious service: Not on file    Active member of club or organization: Not on  file    Attends meetings of clubs or organizations: Not on file    Relationship status: Not on file  Other Topics Concern  . Not on file  Social History Narrative   Lives at home alone   Right handed   Caffeine: weekly, not daily   Lives in a ***. Smoking: *** Occupation: ***  Environmental HistorySurveyor, minerals in the house: Copywriter, advertising in the family room: {Blank single:19197::"yes","no"} Carpet in the bedroom: {Blank single:19197::"yes","no"} Heating: {Blank single:19197::"electric","gas"} Cooling: {Blank single:19197::"central","window"} Pet: {Blank single:19197::"yes ***","no"}  Family History: Family History  Problem Relation Age of Onset  . Multiple sclerosis Mother   . Diabetes Maternal Grandmother   . Pseudotumor cerebri Neg Hx   . Migraines Neg Hx    Problem                               Relation Asthma                                   *** Eczema                                *** Food allergy                          ***  Allergic rhino conjunctivitis     ***  Review of Systems  Constitutional: Negative for appetite change, chills, fever and unexpected weight change.  HENT: Negative for congestion and rhinorrhea.   Eyes: Negative for itching.  Respiratory: Negative for cough, chest tightness, shortness of breath and wheezing.   Cardiovascular: Negative for chest pain.  Gastrointestinal: Negative for abdominal pain.  Genitourinary: Negative for difficulty urinating.  Skin: Negative for rash.  Neurological: Negative for headaches.   Objective: There were no vitals taken for this visit. There is no height or weight on file to calculate BMI. Physical Exam  Constitutional: She is oriented to person, place, and time. She appears well-developed and well-nourished.  HENT:  Head: Normocephalic and atraumatic.  Right Ear: External ear normal.  Left Ear: External ear normal.  Nose: Nose normal.  Mouth/Throat: Oropharynx is  clear and moist.  Eyes: Conjunctivae and EOM are normal.  Neck: Neck supple.  Cardiovascular: Normal rate, regular rhythm and normal heart sounds. Exam reveals no gallop and no friction rub.  No murmur heard. Pulmonary/Chest: Effort normal and breath sounds normal. She has no wheezes. She has no rales.  Abdominal: Soft.  Neurological: She is alert and oriented to person, place, and time.  Skin: Skin is warm. No rash noted.  Psychiatric: She has a normal mood and affect. Her behavior is normal.  Nursing note and vitals reviewed.  The plan was reviewed with the patient/family, and all questions/concerned were addressed.  It was my pleasure to see Kimberly Villa today and participate in her care. Please feel free to contact me with any questions or concerns.  Sincerely,  Rexene Alberts, DO Allergy & Immunology  [facility] office: [phone] [facility] office: [place] office: [phone]

## 2019-10-28 NOTE — Progress Notes (Addendum)
PATIENT: Kimberly Villa DOB: November 02, 1996  REASON FOR VISIT: follow up HISTORY FROM: patient  Chief Complaint  Patient presents with  . Follow-up    Room 2, doing well. Doing about the same.     HISTORY OF PRESENT ILLNESS: Today 10/29/19 Kimberly Villa is a 23 y.o. female here today for follow up for IIH. She continues Diamox  twice daily. Last eye exm was in 07/2019 which showed improvement of papilledema.  She reports that headaches are only improved.  She rarely has a bad headache.  She is continuing the conversation with her GYN provider regarding removal of Mirena implant.  She is also working with primary care for weight management.  HISTORY: (copied from my note on 08/06/2019)  Kimberly Villa is a 23 y.o. female here today for follow up of IIH. She reports that ophthalmology saw optic nerve edema and sent her for eval. She has not had much trouble with headaches. LP opening pressure of 41. MRI shows empty sella. She was started on Diamox  twice daily. She reports that she can see clearly out of the right eye. Her left eye is still blurry. She reports that optic pressures had improved. She is seeing PCP next week. She continues to have sinus congestion. She is taking antihistamine and Singulair daily. She denies fever, chills or sinus pain. [facility] was [facility] to [facility] but has not established an appt. She is working closely with GYN for consideration of continuing birth control (Mirena).   HISTORY: (copied from Dr Trevor Mace note on 06/23/2019)  Kimberly Kimberly a 22 y.o.femalehere as requested by Georges Mouse [name]*for optic nerve head edema. Here with her mother who provides much information. She went to an optometrist who sent her to an ophthalmologist and now neurology. Vision changes started a year ago, stable, left eye is worse, no headaches or floater, she has episodes of blurry vision, she has fullness in her ear bit attributed to  sinuses. She has had headaches, more pressure, on the right side, light sensitivity, no nausea, no vomiting, last headache a month ago improved with new glasses. She has an IUD for 5 years, she has gained weight, lots of stress with graduation of school. No head trauma.No other focal neurologic deficits, associated symptoms, inciting events or modifiable factors.  Reviewed notes, labs and imaging from outside physicians, which showed:  I reviewed Dr. Margaretmary Eddy notes. No recent head trauma. No family history of sarcoidosis. She wears an IUD. No new headaches. No diplopia. No history of blood clots. No vaping. No passive smoke exposure. There is a history of alcohol use and caffeine use. OD 20/25 -2, OS 20/50 -2, intraocular pressures 19 OS and OD, examination reviewed and showed pupils equal round and reactive, no APD, open angles, extraocular movements full, external exam normal, slit-lamp unremarkable, funduscopic exam showed normal optic nerves, visual field was enlarged blind spot OS, OCT HRT interpretation was positive disc edema bilaterally. Bilateral disc edema with possibly early visual field changes. Suspect IIH.   REVIEW OF SYSTEMS: Out of a complete 14 system review of symptoms, the patient complains only of the following symptoms, headaches and all other reviewed systems are negative.  ALLERGIES: Allergies  Allergen Reactions  . Dust Mite Extract Other (See Comments)    Makes allergies flare up Makes allergies flare up     HOME MEDICATIONS: Outpatient Medications Prior to Visit  Medication Sig Dispense Refill  . acetaZOLAMIDE (DIAMOX) 250 MG tablet Take 2 tablets (500 mg total)  by mouth 2 (two) times daily. 120 tablet 2  . albuterol (VENTOLIN HFA) 108 (90 Base) MCG/ACT inhaler Inhale 1-2 puffs into the lungs every 6 (six) hours as needed for wheezing or shortness of breath. 18 g 3  . beclomethasone (QVAR) 40 MCG/ACT inhaler Inhale 2 puffs into the lungs 2 (two) times  daily. 1 Inhaler 3  . cetirizine (ZYRTEC) 10 MG tablet TAKE 1 TABLET(10 MG) BY MOUTH DAILY 30 tablet 0  . mometasone (NASONEX) 50 MCG/ACT nasal spray Place 2 sprays into the nose daily. (Patient not taking: Reported on 10/29/2019) 17 g 12   No facility-administered medications prior to visit.    PAST MEDICAL HISTORY: Past Medical History:  Diagnosis Date  . Asthma   . IIH (idiopathic intracranial hypertension)     PAST SURGICAL HISTORY: Past Surgical History:  Procedure Laterality Date  . previous surgery     No previous surgery    FAMILY HISTORY: Family History  Problem Relation Age of Onset  . Multiple sclerosis Mother   . Diabetes Maternal Grandmother   . Pseudotumor cerebri Neg Hx   . Migraines Neg Hx     SOCIAL HISTORY: Social History   Socioeconomic History  . Marital status: Single    Spouse name: Not on file  . Number of children: 0  . Years of education: Not on file  . Highest education level: Bachelor's degree (e.g., BA, AB, BS)  Occupational History  . Not on file  Tobacco Use  . Smoking status: Never Smoker  . Smokeless tobacco: Never Used  Substance and Sexual Activity  . Alcohol use: Yes    Comment: wine occasionally  . Drug use: No  . Sexual activity: Yes    Birth control/protection: I.U.D.  Other Topics Concern  . Not on file  Social History Narrative   Lives at home alone   Right handed   Caffeine: weekly, not daily   Social Determinants of Health   Financial Resource Strain:   . Difficulty of Paying Living Expenses: Not on file  Food Insecurity:   . Worried About Charity fundraiser in the Last Year: Not on file  . Ran Out of Food in the Last Year: Not on file  Transportation Needs:   . Lack of Transportation (Medical): Not on file  . Lack of Transportation (Non-Medical): Not on file  Physical Activity:   . Days of Exercise per Week: Not on file  . Minutes of Exercise per Session: Not on file  Stress:   . Feeling of Stress : Not  on file  Social Connections:   . Frequency of Communication with Friends and Family: Not on file  . Frequency of Social Gatherings with Friends and Family: Not on file  . Attends Religious Services: Not on file  . Active Member of Clubs or Organizations: Not on file  . Attends Archivist Meetings: Not on file  . Marital Status: Not on file  Intimate Partner Violence:   . Fear of Current or Ex-Partner: Not on file  . Emotionally Abused: Not on file  . Physically Abused: Not on file  . Sexually Abused: Not on file      PHYSICAL EXAM  Vitals:   10/29/19 1343  BP: (!) 143/87  Pulse: (!) 102  Temp: (!) 97 F (36.1 C)  Weight: 240 lb (108.9 kg)  Height: 5\' 3"  (1.6 m)   Body mass index is 42.51 kg/m.  Generalized: Well developed, in no acute distress  Cardiology:  normal rate and rhythm, no murmur noted Respiratory: Clear to auscultation bilaterally Neurological examination  Mentation: Alert oriented to time, place, history taking. Follows all commands speech and language fluent Cranial nerve II-XII: Pupils were equal round reactive to light. Extraocular movements were full, visual field were full on confrontational test.  Motor: The motor testing reveals 5 over 5 strength of all 4 extremities. Good symmetric motor tone is noted throughout.  Gait and station: Gait is normal.   DIAGNOSTIC DATA (LABS, IMAGING, TESTING) - I reviewed patient records, labs, notes, testing and imaging myself where available.  No flowsheet data found.   Lab Results  Component Value Date   WBC 7.5 06/18/2019   HGB 12.0 06/18/2019   HCT 38.9 06/18/2019   MCV 77 (L) 06/18/2019   PLT 478 (H) 06/18/2019      Component Value Date/Time   NA 140 09/17/2019 1354   K 4.6 09/17/2019 1354   CL 107 (H) 09/17/2019 1354   CO2 20 09/17/2019 1354   GLUCOSE 88 09/17/2019 1354   BUN 13 09/17/2019 1354   CREATININE 0.75 09/17/2019 1354   CALCIUM 9.3 09/17/2019 1354   PROT 6.7 09/17/2019 1354    ALBUMIN 4.1 09/17/2019 1354   AST 18 09/17/2019 1354   ALT 29 09/17/2019 1354   ALKPHOS 63 09/17/2019 1354   BILITOT 0.2 09/17/2019 1354   GFRNONAA 112 09/17/2019 1354   GFRAA 129 09/17/2019 1354   No results found for: CHOL, HDL, LDLCALC, LDLDIRECT, TRIG, CHOLHDL Lab Results  Component Value Date   HGBA1C 6.1 (H) 09/17/2019   No results found for: VITAMINB12 Lab Results  Component Value Date   TSH 1.350 06/18/2019    ASSESSMENT AND PLAN 23 y.o. year old female  has a past medical history of Asthma and IIH (idiopathic intracranial hypertension). here with     ICD-10-CM   1. IIH (idiopathic intracranial hypertension)  G93.2     Ms. Bosak is doing very well on Diamox 500 mg twice daily.  We will continue current treatment regimen.  We have discussed etiology of idiopathic intracranial hypertension.  I have also provided her with additional educational materials in the office.  We have discussed data regarding concerns of worsening IIH with hormonal birth control as well as a data contradicting this claim.  She will continue the discussion of whether to continue with Mirena IUD with her GYN provider.  I would recommend nonhormonal forms of birth control if appropriate.  I have provided her with examples of these in her AVS.  She will continue close follow-up with primary care for weight management.  Blood pressure is elevated today however review of most recent blood pressures have been normal.  She reports that blood pressures are normal at home.  She will continue to monitor closely.  She will follow-up with me in 6 months, sooner if needed she verbalizes understanding and agreement with this plan.   No orders of the defined types were placed in this encounter.    No orders of the defined types were placed in this encounter.     I spent 15 minutes with the patient. 50% of this time was spent counseling and educating patient on plan of care and medications.    Shawnie Dapper, FNP-C  10/29/2019, 1:47 PM [facility] [address] [place], [place] [address] [phone]  Made any corrections needed, and agree with history, physical, neuro exam,assessment and plan as stated.     Naomie Dean, MD [facility]

## 2019-10-29 ENCOUNTER — Encounter: Payer: Self-pay | Admitting: Family Medicine

## 2019-10-29 ENCOUNTER — Other Ambulatory Visit: Payer: Self-pay

## 2019-10-29 ENCOUNTER — Ambulatory Visit: Payer: PRIVATE HEALTH INSURANCE | Admitting: Family Medicine

## 2019-10-29 VITALS — BP 143/87 | HR 102 | Temp 97.0°F | Ht 63.0 in | Wt 240.0 lb

## 2019-10-29 DIAGNOSIS — G932 Benign intracranial hypertension: Secondary | ICD-10-CM | POA: Diagnosis not present

## 2019-10-29 NOTE — Patient Instructions (Signed)
  Please continue Diamox 500mg  twice daily  Discuss non hormonal birth control options with GYN  Follow up in 6 months, sooner if needed   Idiopathic Intracranial Hypertension  Idiopathic intracranial hypertension (IIH) is a condition that increases pressure around the brain. The fluid that surrounds the brain and spinal cord (cerebrospinal fluid, CSF) increases and causes the pressure. Idiopathic means that the cause of this condition is not known. IIH affects the brain and spinal cord (is a neurological disorder). If this condition is not treated, it can cause vision loss or blindness. What increases the risk? You are more likely to develop this condition if:  You are severely overweight (obese).  You are a woman who has not gone through menopause.  You take certain medicines, such as birth control or steroids. What are the signs or symptoms? Symptoms of IIH include:  Headaches. This is the most common symptom.  Pain in the shoulders or neck.  Nausea and vomiting.  A "rushing water" or pulsing sound within the ears (pulsatile tinnitus).  Double vision.  Blurred vision.  Brief episodes of complete vision loss. How is this diagnosed? This condition may be diagnosed based on:  Your symptoms.  Your medical history.  CT scan of the brain.  MRI of the brain.  Magnetic resonance venogram (MRV) to check veins in the brain.  Diagnostic lumbar puncture. This is a procedure to remove and examine a sample of cerebrospinal fluid. This procedure can determine whether too much fluid may be causing IIH.  A thorough eye exam to check for swelling or nerve damage in the eyes. How is this treated? Treatment for this condition depends on your symptoms. The goal of treatment is to decrease the pressure around your brain. Common treatments include:  Medicines to decrease the production of spinal fluid and lower the pressure within your skull.  Medicines to prevent or treat  headaches.  Surgery to place drains (shunts) in your brain to remove excess fluid.  Lumbar puncture to remove excess cerebrospinal fluid. Follow these instructions at home:  If you are overweight or obese, work with your health care provider to lose weight.  Take over-the-counter and prescription medicines only as told by your health care provider.  Do not drive or use heavy machinery while taking medicines that can make you sleepy.  Keep all follow-up visits as told by your health care provider. This is important. Contact a health care provider if:  You have changes in your vision, such as: ? Double vision. ? Not being able to see colors (color vision). Get help right away if:  You have any of the following symptoms and they get worse or do not get better. ? Headaches. ? Nausea. ? Vomiting. ? Vision changes or difficulty seeing. Summary  Idiopathic intracranial hypertension (IIH) is a condition that increases pressure around the brain. The cause is not known (is idiopathic).  The most common symptom of IIH is headaches.  Treatment may include medicines or surgery to relieve the pressure on your brain. This information is not intended to replace advice given to you by your health care provider. Make sure you discuss any questions you have with your health care provider. Document Released: 01/10/2002 Document Revised: 10/14/2017 Document Reviewed: 09/22/2016 Elsevier Patient Education  2020 [employer].

## 2019-11-26 ENCOUNTER — Ambulatory Visit: Payer: PRIVATE HEALTH INSURANCE | Admitting: Allergy

## 2019-11-26 NOTE — Progress Notes (Deleted)
New Patient Note  RE: Kimberly Villa MRN: [id number] DOB: 1996/02/08 Date of Office Visit: 11/26/2019  Referring provider: Sharlene Dory* Primary care provider: Sharlene Dory, DO  Chief Complaint: No chief complaint on file.  History of Present Illness: I had the pleasure of seeing Kimberly Villa for initial evaluation at the [facility] on 11/26/2019. She is a 24 y.o. female, who is referred here by Sharlene Dory, DO for the evaluation of allergies.  Rhinitis: She reports symptoms of ***. Symptoms have been going on for *** years. The symptoms are present *** all year around with worsening in ***. Other triggers include exposure to ***. Anosmia: ***. Headache: ***. She has used *** with ***fair improvement in symptoms. Sinus infections: ***. Previous work up includes: ***. Previous ENT evaluation: ***. Previous sinus imaging: ***. History of nasal polyps: ***. Last eye exam: ***. History of reflux: ***.  The patient has a history of seasonal allergies.  She has ear pain/fullness bilaterally.  She is taking Zyrtec which seems to help a little bit.  She did not do well with Flonase and is not taking anything else at this time.  She has never tried Nasonex or Rhinocort.  She also has a history of asthma.  She has albuterol and is also taking Qvar daily.  While she is not rinsing her mouth out, she is not having any oral lesions.  Assessment and Plan: Kimberly Villa is a 24 y.o. female with: No problem-specific Assessment & Plan notes found for this encounter.  No follow-ups on file.  No orders of the defined types were placed in this encounter.  Lab Orders  No laboratory test(s) ordered today    Other allergy screening: Asthma: {Blank single:19197::"yes","no"} Rhino conjunctivitis: {Blank single:19197::"yes","no"} Food allergy: {Blank single:19197::"yes","no"} Medication allergy: {Blank single:19197::"yes","no"} Hymenoptera allergy:  {Blank single:19197::"yes","no"} Urticaria: {Blank single:19197::"yes","no"} Eczema:{Blank single:19197::"yes","no"} History of recurrent infections suggestive of immunodeficency: {Blank single:19197::"yes","no"}  Diagnostics: Spirometry:  Tracings reviewed. Her effort: {Blank single:19197::"Good reproducible efforts.","It was hard to get consistent efforts and there is a question as to whether this reflects a maximal maneuver.","Poor effort, data can not be interpreted."} FVC: ***L FEV1: ***L, ***% predicted FEV1/FVC ratio: ***% Interpretation: {Blank single:19197::"Spirometry consistent with mild obstructive disease","Spirometry consistent with moderate obstructive disease","Spirometry consistent with severe obstructive disease","Spirometry consistent with possible restrictive disease","Spirometry consistent with mixed obstructive and restrictive disease","Spirometry uninterpretable due to technique","Spirometry consistent with normal pattern","No overt abnormalities noted given today's efforts"}.  Please see scanned spirometry results for details.  Skin Testing: {Blank single:19197::"Select foods","Environmental allergy panel","Environmental allergy panel and select foods","Food allergy panel","None","Deferred due to recent antihistamines use"}. Positive test to: ***. Negative test to: ***.  Results discussed with patient/family.   Past Medical History: Patient Active Problem List   Diagnosis Date Noted  . Seasonal allergies 09/17/2019  . Prediabetes 09/17/2019  . Morbid obesity (HCC) 09/17/2019  . IIH (idiopathic intracranial hypertension) 06/18/2019  . Mild intermittent asthma without complication 05/17/2017  . Chronic allergic rhinitis 05/17/2017  . Chronic eczema 05/17/2017   Past Medical History:  Diagnosis Date  . Asthma   . IIH (idiopathic intracranial hypertension)    Past Surgical History: Past Surgical History:  Procedure Laterality Date  . previous surgery     No  previous surgery   Medication List:  Current Outpatient Medications  Medication Sig Dispense Refill  . acetaZOLAMIDE (DIAMOX) 250 MG tablet Take 2 tablets (500 mg total) by mouth 2 (two) times daily. 120 tablet 2  . albuterol (VENTOLIN  HFA) 108 (90 Base) MCG/ACT inhaler Inhale 1-2 puffs into the lungs every 6 (six) hours as needed for wheezing or shortness of breath. 18 g 3  . beclomethasone (QVAR) 40 MCG/ACT inhaler Inhale 2 puffs into the lungs 2 (two) times daily. 1 Inhaler 3  . cetirizine (ZYRTEC) 10 MG tablet TAKE 1 TABLET(10 MG) BY MOUTH DAILY 30 tablet 0  . mometasone (NASONEX) 50 MCG/ACT nasal spray Place 2 sprays into the nose daily. (Patient not taking: Reported on 10/29/2019) 17 g 12   No current facility-administered medications for this visit.   Allergies: Allergies  Allergen Reactions  . Dust Mite Extract Other (See Comments)    Makes allergies flare up Makes allergies flare up    Social History: Social History   Socioeconomic History  . Marital status: Single    Spouse name: Not on file  . Number of children: 0  . Years of education: Not on file  . Highest education level: Bachelor's degree (e.g., BA, AB, BS)  Occupational History  . Not on file  Tobacco Use  . Smoking status: Never Smoker  . Smokeless tobacco: Never Used  Substance and Sexual Activity  . Alcohol use: Yes    Comment: wine occasionally  . Drug use: No  . Sexual activity: Yes    Birth control/protection: I.U.D.  Other Topics Concern  . Not on file  Social History Narrative   Lives at home alone   Right handed   Caffeine: weekly, not daily   Social Determinants of Health   Financial Resource Strain:   . Difficulty of Paying Living Expenses: Not on file  Food Insecurity:   . Worried About Programme researcher, broadcasting/film/video in the Last Year: Not on file  . Ran Out of Food in the Last Year: Not on file  Transportation Needs:   . Lack of Transportation (Medical): Not on file  . Lack of Transportation  (Non-Medical): Not on file  Physical Activity:   . Days of Exercise per Week: Not on file  . Minutes of Exercise per Session: Not on file  Stress:   . Feeling of Stress : Not on file  Social Connections:   . Frequency of Communication with Friends and Family: Not on file  . Frequency of Social Gatherings with Friends and Family: Not on file  . Attends Religious Services: Not on file  . Active Member of Clubs or Organizations: Not on file  . Attends Banker Meetings: Not on file  . Marital Status: Not on file   Lives in a ***. Smoking: *** Occupation: ***  Environmental HistorySurveyor, minerals in the house: Copywriter, advertising in the family room: {Blank single:19197::"yes","no"} Carpet in the bedroom: {Blank single:19197::"yes","no"} Heating: {Blank single:19197::"electric","gas"} Cooling: {Blank single:19197::"central","window"} Pet: {Blank single:19197::"yes ***","no"}  Family History: Family History  Problem Relation Age of Onset  . Multiple sclerosis Mother   . Diabetes Maternal Grandmother   . Pseudotumor cerebri Neg Hx   . Migraines Neg Hx    Problem                               Relation Asthma                                   *** Eczema                                ***  Food allergy                          *** Allergic rhino conjunctivitis     ***  Review of Systems  Constitutional: Negative for appetite change, chills, fever and unexpected weight change.  HENT: Negative for congestion and rhinorrhea.   Eyes: Negative for itching.  Respiratory: Negative for cough, chest tightness, shortness of breath and wheezing.   Cardiovascular: Negative for chest pain.  Gastrointestinal: Negative for abdominal pain.  Genitourinary: Negative for difficulty urinating.  Skin: Negative for rash.  Allergic/Immunologic: Negative for environmental allergies and food allergies.  Neurological: Negative for headaches.   Objective: There  were no vitals taken for this visit. There is no height or weight on file to calculate BMI. Physical Exam  Constitutional: She is oriented to person, place, and time. She appears well-developed and well-nourished.  HENT:  Head: Normocephalic and atraumatic.  Right Ear: External ear normal.  Left Ear: External ear normal.  Nose: Nose normal.  Mouth/Throat: Oropharynx is clear and moist.  Eyes: Conjunctivae and EOM are normal.  Cardiovascular: Normal rate, regular rhythm and normal heart sounds. Exam reveals no gallop and no friction rub.  No murmur heard. Pulmonary/Chest: Effort normal and breath sounds normal. She has no wheezes. She has no rales.  Abdominal: Soft.  Musculoskeletal:     Cervical back: Neck supple.  Neurological: She is alert and oriented to person, place, and time.  Skin: Skin is warm. No rash noted.  Psychiatric: She has a normal mood and affect. Her behavior is normal.  Nursing note and vitals reviewed.  The plan was reviewed with the patient/family, and all questions/concerned were addressed.  It was my pleasure to see Alayshia today and participate in her care. Please feel free to contact me with any questions or concerns.  Sincerely,  Rexene Alberts, DO Allergy & Immunology  [facility] office: [phone] [facility] office: [place] office: [phone]

## 2019-12-10 ENCOUNTER — Ambulatory Visit (INDEPENDENT_AMBULATORY_CARE_PROVIDER_SITE_OTHER): Payer: PRIVATE HEALTH INSURANCE | Admitting: Family Medicine

## 2019-12-24 ENCOUNTER — Ambulatory Visit (INDEPENDENT_AMBULATORY_CARE_PROVIDER_SITE_OTHER): Payer: PRIVATE HEALTH INSURANCE | Admitting: Family Medicine

## 2019-12-24 ENCOUNTER — Encounter: Payer: PRIVATE HEALTH INSURANCE | Admitting: Family Medicine

## 2020-01-28 ENCOUNTER — Other Ambulatory Visit: Payer: Self-pay | Admitting: *Deleted

## 2020-01-28 MED ORDER — ACETAZOLAMIDE 250 MG PO TABS: 500.0000 mg | ORAL_TABLET | Freq: Two times a day (BID) | ORAL | 2 refills | Status: DC

## 2020-02-04 ENCOUNTER — Encounter: Payer: PRIVATE HEALTH INSURANCE | Admitting: Family Medicine

## 2020-02-25 ENCOUNTER — Encounter: Payer: PRIVATE HEALTH INSURANCE | Admitting: Family Medicine

## 2020-02-25 DIAGNOSIS — Z0289 Encounter for other administrative examinations: Secondary | ICD-10-CM

## 2020-03-17 ENCOUNTER — Telehealth: Payer: Self-pay | Admitting: Family Medicine

## 2020-03-17 NOTE — Telephone Encounter (Signed)
-----   Message from Elwin Mocha, MD sent at 03/15/2020 12:28 PM EDT ----- Just FYI that I saw Mrs. Leary today for IIH testing in the eye clinic. Appears her disc edema is either completely resolved or nearly resolved on her Diamox 500 mg PO BID. I plan on repeating testing in 3 months one more time and if stable then will spread to 6 months.   Thanks, Thayer Ohm

## 2020-03-17 NOTE — Telephone Encounter (Signed)
Thank you so much. We will continue to follow clinically.

## 2020-04-01 ENCOUNTER — Telehealth: Payer: Self-pay

## 2020-04-01 MED ORDER — FLUTICASONE PROPIONATE HFA 110 MCG/ACT IN AERO: 2.0000 | INHALATION_SPRAY | Freq: Two times a day (BID) | RESPIRATORY_TRACT | 12 refills | Status: DC

## 2020-04-01 NOTE — Telephone Encounter (Signed)
Flovent sent in. Ty.

## 2020-04-01 NOTE — Telephone Encounter (Signed)
Qvar Redihaler not covered by [employer]. Preferred alternatives: Arnuity Ellipta, Flovent Diskus, and/or Flovent HFA

## 2020-04-01 NOTE — Telephone Encounter (Signed)
Called left detailed message that alternative inhaler sent in

## 2020-04-28 ENCOUNTER — Ambulatory Visit: Payer: PRIVATE HEALTH INSURANCE | Admitting: Family Medicine

## 2020-05-07 ENCOUNTER — Ambulatory Visit: Payer: PRIVATE HEALTH INSURANCE | Admitting: Family Medicine

## 2020-05-12 ENCOUNTER — Ambulatory Visit: Payer: PRIVATE HEALTH INSURANCE | Admitting: Family Medicine

## 2020-05-12 ENCOUNTER — Encounter: Payer: Self-pay | Admitting: Family Medicine

## 2020-06-23 ENCOUNTER — Other Ambulatory Visit: Payer: Self-pay | Admitting: Adult Health

## 2020-06-26 ENCOUNTER — Encounter: Payer: Self-pay | Admitting: Family Medicine

## 2020-06-26 ENCOUNTER — Telehealth (INDEPENDENT_AMBULATORY_CARE_PROVIDER_SITE_OTHER): Payer: PRIVATE HEALTH INSURANCE | Admitting: Family Medicine

## 2020-06-26 DIAGNOSIS — G932 Benign intracranial hypertension: Secondary | ICD-10-CM

## 2020-06-26 MED ORDER — ACETAZOLAMIDE 250 MG PO TABS: 500.0000 mg | ORAL_TABLET | Freq: Two times a day (BID) | ORAL | 3 refills | Status: DC

## 2020-06-26 NOTE — Progress Notes (Addendum)
PATIENT: Kimberly Villa DOB: 1996/11/03  REASON FOR VISIT: follow up HISTORY FROM: patient  Virtual Visit via Telephone Note  I connected with Kimberly Villa on 06/26/20 at  1:30 PM EDT by telephone and verified that I am speaking with the correct person using two identifiers.   I discussed the limitations, risks, security and privacy concerns of performing an evaluation and management service by telephone and the availability of in person appointments. I also discussed with the patient that there may be a patient responsible charge related to this service. The patient expressed understanding and agreed to proceed.   History of Present Illness:  06/26/20 [name] is a 24 y.o. female here today for follow up for IIH. She is doing well on Diamox 500mg  BID. She denies recent headaches, very rarely has a headache. She was seen by ophthalmology in 04/2020 and reports eye exam was normal. She continues to work on 05/2020. She is feeling well and without complaints.   HISTORY: (copied from my note on 10/29/2019)  [name] is a 25 y.o. female here today for follow up for IIH. She continues Diamox 500mg  twice daily. Last eye exm was in 07/2019 which showed improvement of papilledema.  She reports that headaches are only improved.  She rarely has a bad headache.  She is continuing the conversation with her GYN provider regarding removal of Mirena implant.  She is also working with primary care for weight management.  HISTORY: (copied from my note on 08/06/2019)  Elvi Beckhamis a 24 y.o.femalehere today for follow up of IIH.She reports that ophthalmology saw optic nerve edema and sent her for eval. She has not had much trouble with headaches.LP opening pressure of 41. MRI shows empty sella. She was started on Diamox 250mg  twice daily.She reports that she can see clearly out of the right eye. Her left eye is still blurry. She reports that optic pressures had improved.  She is seeing PCP next week.She continues to have sinus congestion. She is taking antihistamine and Singulair daily. She denies fever, chills or sinus pain.[facility] but has not established an appt. She is working closely with GYN for consideration of continuing birth control (Mirena).   HISTORY: (copied fromDr Ahern'snote on 06/23/2019)  30 Beckhamis a 24 y.o.femalehere as requested by [name]*for optic nerve head edema. Here with her mother who provides much information. She went to an optometrist who sent her to an ophthalmologist and now neurology. Vision changes started a year ago, stable, left eye is worse, no headaches or floater, she has episodes of blurry vision, she has fullness in her ear bit attributed to sinuses. She has had headaches, more pressure, on the right side, light sensitivity, no nausea, no vomiting, last headache a month ago improved with new glasses. She has an IUD for 5 years, she has gained weight, lots of stress with graduation of school. No head trauma.No other focal neurologic deficits, associated symptoms, inciting events or modifiable factors.  Reviewed notes, labs and imaging from outside physicians, which showed:  I reviewed Dr. 08/23/2019 notes. No recent head trauma. No family history of sarcoidosis. She wears an IUD. No new headaches. No diplopia. No history of blood clots. No vaping. No passive smoke exposure. There is a history of alcohol use and caffeine use. OD 20/25 -2, OS 20/50 -2, intraocular pressures 19 OS and OD, examination reviewed and showed pupils equal round and reactive, no APD, open angles, extraocular movements full,  external exam normal, slit-lamp unremarkable, funduscopic exam showed normal optic nerves, visual field was enlarged blind spot OS, OCT HRT interpretation was positive disc edema bilaterally. Bilateral disc edema with possibly early visual field changes.  Suspect IIH.    Observations/Objective:  Generalized: Well developed, in no acute distress  Mentation: Alert oriented to time, place, history taking. Follows all commands speech and language fluent   Assessment and Plan:  24 y.o. year old female  has a past medical history of Asthma and IIH (idiopathic intracranial hypertension). here with    ICD-10-CM   1. IIH (idiopathic intracranial hypertension)  G93.2    She is doing well. We will continue Diamox 500mg  twice daily. We have discussed need for weight management and consistency with medication dosing. I am pleased that she is doing so well. We have discussed trying to wean Diamox if headaches continue to be well controlled, eye exams continue to be normal and she is able to lose some weight. She is working closely with PCP. She recently moved to [place]. She will call to follow up in 1 year, sooner if needed. She verbalizes understanding and agreement with this plan.   No orders of the defined types were placed in this encounter.   Meds ordered this encounter  Medications  . acetaZOLAMIDE (DIAMOX) 250 MG tablet    Sig: Take 2 tablets (500 mg total) by mouth 2 (two) times daily.    Dispense:  360 tablet    Refill:  3    Order Specific Question:   Supervising Provider    Answer:   Bellaire Anson Fret     Follow Up Instructions:  I discussed the assessment and treatment plan with the patient. The patient was provided an opportunity to ask questions and all were answered. The patient agreed with the plan and demonstrated an understanding of the instructions.   The patient was advised to call back or seek an in-person evaluation if the symptoms worsen or if the condition fails to improve as anticipated.  I provided 20 minutes of non-face-to-face time during this encounter. Patient is located in her parked car. Provider is in the office.    [id number], NP   Made any corrections needed, and agree with history, physical,  neuro exam,assessment and plan as stated.     Shawnie Dapper, MD [facility]

## 2020-09-03 IMAGING — XA DG FLUORO GUIDE SPINAL/SI JT INJ*L*
1 series · 1 of 1 positions shown · non-contrast
Comparison: none

CLINICAL DATA: Suspected intracranial hypertension.

[Series 1: ortho adipose · 1 of 1 slices shown]
[im 1/1]
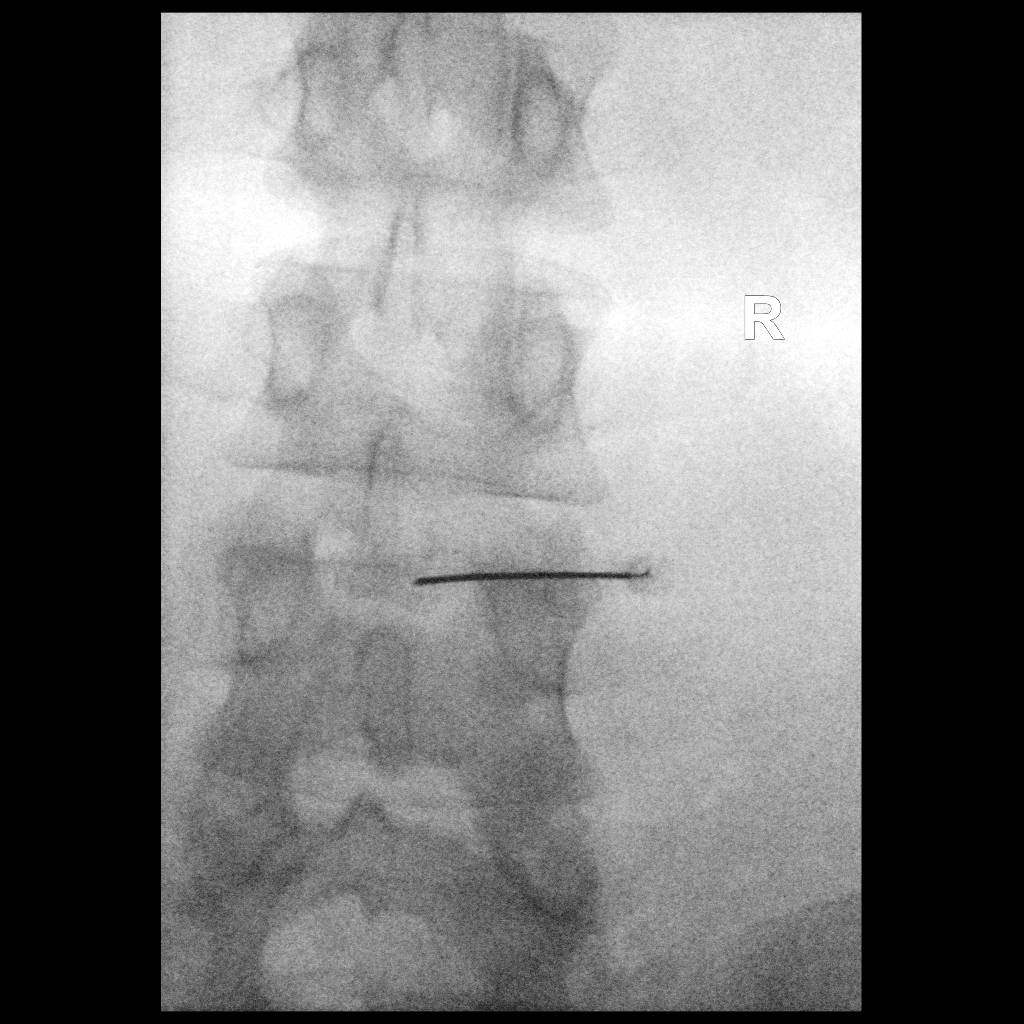

[1 of 1 positions shown; findings below may reference images not displayed]

EXAM:
DIAGNOSTIC LUMBAR PUNCTURE UNDER FLUOROSCOPIC GUIDANCE

FLUOROSCOPY TIME:  Fluoroscopy Time:  2 seconds

Radiation Exposure Index (if provided by the fluoroscopic device):
2.1 mGy

Number of Acquired Spot Images: 0

PROCEDURE:
Informed consent was obtained from the patient prior to the
procedure, including potential complications of headache, allergy,
and pain. With the patient prone, the lower back was prepped with
Betadine. 1% Lidocaine was used for local anesthesia. Lumbar
puncture was performed at the L3-L4 level via a right interlaminar
approach using a 6 inch 20 gauge needle with return of clear,
colorless CSF with an opening pressure of 41 cm water. 21 ml of CSF
were obtained for laboratory studies. Closing pressure was 17 cm
water. The patient tolerated the procedure well and there were no
apparent complications.
IMPRESSION: 1. Technically successful fluoroscopically guided lumbar puncture.
2. Elevated CSF opening pressure of 41 cm water.

## 2021-06-03 ENCOUNTER — Telehealth: Payer: Self-pay | Admitting: Family Medicine

## 2021-06-03 MED ORDER — ACETAZOLAMIDE 250 MG PO TABS: 500.0000 mg | ORAL_TABLET | Freq: Two times a day (BID) | ORAL | 0 refills | Status: DC

## 2021-06-03 NOTE — Telephone Encounter (Signed)
Pt requesting refill for acetaZOLAMIDE (DIAMOX) 250 MG tablet. Pharmacy [facility] [phone].

## 2021-08-31 ENCOUNTER — Other Ambulatory Visit: Payer: Self-pay

## 2021-08-31 MED ORDER — ACETAZOLAMIDE 250 MG PO TABS: 500.0000 mg | ORAL_TABLET | Freq: Two times a day (BID) | ORAL | 0 refills | Status: DC

## 2021-09-07 ENCOUNTER — Telehealth: Payer: Self-pay | Admitting: Family Medicine

## 2021-09-07 NOTE — Telephone Encounter (Signed)
LVM & sent mychart msg informing pt of r/s needed for 10/27 appt- Amy leaving early.

## 2021-09-10 ENCOUNTER — Ambulatory Visit: Payer: PRIVATE HEALTH INSURANCE | Admitting: Family Medicine

## 2021-11-17 ENCOUNTER — Encounter: Payer: Self-pay | Admitting: Family Medicine

## 2021-11-17 ENCOUNTER — Telehealth: Payer: No Typology Code available for payment source | Admitting: Family Medicine

## 2021-11-17 ENCOUNTER — Telehealth (INDEPENDENT_AMBULATORY_CARE_PROVIDER_SITE_OTHER): Payer: No Typology Code available for payment source | Admitting: Family Medicine

## 2021-11-17 DIAGNOSIS — G932 Benign intracranial hypertension: Secondary | ICD-10-CM

## 2021-11-17 MED ORDER — ACETAZOLAMIDE 250 MG PO TABS: 500.0000 mg | ORAL_TABLET | Freq: Two times a day (BID) | ORAL | 3 refills | Status: DC

## 2021-11-17 NOTE — Progress Notes (Signed)
PATIENT: Kimberly Villa DOB: 1995/12/03  REASON FOR VISIT: follow up HISTORY FROM: patient  Virtual Visit via Telephone Note  I connected with Kimberly Villa on 11/17/21 at  2:00 PM EST by telephone and verified that I am speaking with the correct person using two identifiers.   I discussed the limitations, risks, security and privacy concerns of performing an evaluation and management service by telephone and the availability of in person appointments. I also discussed with the patient that there may be a patient responsible charge related to this service. The patient expressed understanding and agreed to proceed.   History of Present Illness:  11/17/21 ALL (Mychart) Kimberly Villa returns for follow up for IIH. She continues acetazolamide 500mg  BID. She reports headaches are well managed. She stopped acetazolamide around 01/2021 after learning that she was pregnant. She reports headaches did worsen and she had "a blind spot" in the right eye when seen in follow up with Dr 02/2021. Unfortunately, she had a miscarriage. She did resume acetazolamide and headaches resolved. Blind spot has also resolved. She has no plans for pregnancy in the near future. She admits that she has not done as well with weight loss as she would like.   06/26/2020 ALL (Mychart) Kimberly Villa is a 26 y.o. female here today for follow up for IIH. She is doing well on Diamox 500mg  BID. She denies recent headaches, very rarely has a headache. She was seen by ophthalmology in 04/2020 and reports eye exam was normal. She continues to work on . She is feeling well and without complaints.   HISTORY: (copied from my note on 10/29/2019)  Kimberly Villa is a 26 y.o. female here today for follow up for IIH. She continues Diamox 500mg  twice daily. Last eye exm was in 07/2019 which showed improvement of papilledema.  She reports that headaches are only improved.  She rarely has a bad headache.  She is continuing the  conversation with her GYN provider regarding removal of Mirena implant.  She is also working with primary care for weight management.   HISTORY: (copied from my note on 08/06/2019)   Kimberly Villa is a 26 y.o. female here today for follow up of IIH. She reports that ophthalmology saw optic nerve edema and sent her for eval. She has not had much trouble with headaches. LP opening pressure of 41. MRI shows empty sella. She was started on Diamox 250mg  twice daily. She reports that she can see clearly out of the right eye. Her left eye is still blurry. She reports that optic pressures had improved. She is seeing PCP next week. She continues to have sinus congestion. She is taking antihistamine and Singulair daily. She denies fever, chills or sinus pain. [facility] was [facility] to [facility] but has not established an appt. She is working closely with GYN for consideration of continuing birth control (Mirena).    HISTORY: (copied from Dr 08/08/2019 note on 06/23/2019)   HPI:  Kimberly Villa is a 26 y.o. female here as requested by Trevor Mace* for optic nerve head edema. Here with her mother who provides much information. She went to an optometrist who sent her to an ophthalmologist and now neurology. Vision changes started a year ago, stable, left eye is worse, no headaches or floater, she has episodes of blurry vision, she has fullness in her ear bit attributed to sinuses. She has had headaches, more pressure, on the right side, light sensitivity, no nausea, no vomiting, last headache a  month ago improved with new glasses. She has an IUD for 5 years, she has gained weight, lots of stress with graduation of school. No head trauma. No other focal neurologic deficits, associated symptoms, inciting events or modifiable factors.   Reviewed notes, labs and imaging from outside physicians, which showed:    I reviewed Dr. Margaretmary Eddy notes.  No recent head trauma.  No family history of sarcoidosis.   She wears an IUD.  No new headaches.  No diplopia.  No history of blood clots.  No vaping.  No passive smoke exposure.  There is a history of alcohol use and caffeine use.  OD 20/25 -2, OS 20/50 -2, intraocular pressures 19 OS and OD, examination reviewed and showed pupils equal round and reactive, no APD, open angles, extraocular movements full, external exam normal, slit-lamp unremarkable, funduscopic exam showed normal optic nerves, visual field was enlarged blind spot OS, OCT HRT interpretation was positive disc edema bilaterally.  Bilateral disc edema with possibly early visual field changes.  Suspect IIH.    Observations/Objective:  Generalized: Well developed, in no acute distress  Mentation: Alert oriented to time, place, history taking. Follows all commands speech and language fluent   Assessment and Plan:  26 y.o. year old female  has a past medical history of Asthma and IIH (idiopathic intracranial hypertension). here with    ICD-10-CM   1. IIH (idiopathic intracranial hypertension)  G93.2       She is doing well. We will continue Diamox 500mg  twice daily. We have discussed need for weight management. She is working closely with PCP. She is aware that she should not become pregnant on acetazolamide. She will call me with any concerns of or plans for pregnancy. She will call to follow up in 1 year, sooner if needed. She verbalizes understanding and agreement with this plan.   No orders of the defined types were placed in this encounter.   No orders of the defined types were placed in this encounter.    Follow Up Instructions:  I discussed the assessment and treatment plan with the patient. The patient was provided an opportunity to ask questions and all were answered. The patient agreed with the plan and demonstrated an understanding of the instructions.   The patient was advised to call back or seek an in-person evaluation if the symptoms worsen or if the condition fails to  improve as anticipated.  I provided 20 minutes of non-face-to-face time during this encounter. Patient is located in her parked car. Provider is in the office.    , NP

## 2021-11-17 NOTE — Patient Instructions (Signed)
Below is our plan:  We will continue acetazolamide 500mg  twice daily. Please continue working on weight management. I advised against pregnancy while taking acetazolamide. Please call with any concerns of or plans to become pregnant and we will wean acetazolamide.   Please make sure you are staying well hydrated. I recommend 50-60 ounces daily. Well balanced diet and regular exercise encouraged. Consistent sleep schedule with 6-8 hours recommended.   Please continue follow up with care team as directed.   Follow up with me in 1 year   You may receive a survey regarding today's visit. I encourage you to leave honest feed back as I do use this information to improve patient care. Thank you for seeing me today!

## 2022-02-01 ENCOUNTER — Other Ambulatory Visit: Payer: Self-pay

## 2022-02-01 MED ORDER — ACETAZOLAMIDE 250 MG PO TABS: 500.0000 mg | ORAL_TABLET | Freq: Two times a day (BID) | ORAL | 3 refills | Status: DC

## 2022-06-23 ENCOUNTER — Encounter (INDEPENDENT_AMBULATORY_CARE_PROVIDER_SITE_OTHER): Payer: Self-pay

## 2024-07-17 ENCOUNTER — Telehealth: Payer: Self-pay | Admitting: Family Medicine

## 2024-07-17 NOTE — Telephone Encounter (Signed)
 Patient said have seen Dr. Maree at [facility], was advised due to finding IIH to restart  acetaZOLAMIDE  (DIAMOX ) 250 MG tablet and to see neurologist for prescription.

## 2024-07-17 NOTE — Telephone Encounter (Signed)
 Last seen 11/2021. Appointment scheduled for 10/23/24. Primary MD: Dr. Ines.   Called pt. Scheduled sooner appt for 07/19/24 at 830a w/ AL,NP. Cx 10/2024 appt since she is coming in sooner.

## 2024-07-17 NOTE — Progress Notes (Unsigned)
 PATIENT: Kimberly Villa DOB: 03-27-1996  REASON FOR VISIT: follow up HISTORY FROM: patient  No chief complaint on file.    HISTORY OF PRESENT ILLNESS:  07/17/24 ALL: Kimberly Villa returns for follow up for IIH. She was last seen 11/2021 and doing well on acetazolamide  500mg  BID.   11/17/21 ALL (Mychart) Kimberly Villa returns for follow up for IIH. She continues acetazolamide  500mg  BID. She reports headaches are well managed. She stopped acetazolamide  around 01/2021 after learning that she was pregnant. She reports headaches did worsen and she had a blind spot in the right eye when seen in follow up with Dr Maree. Unfortunately, she had a miscarriage. She did resume acetazolamide  and headaches resolved. Blind spot has also resolved. She has no plans for pregnancy in the near future. She admits that she has not done as well with weight loss as she would like.   06/26/2020 ALL (Mychart) Kimberly Villa is a 28 y.o. female here today for follow up for IIH. She is doing well on Diamox  500mg  BID. She denies recent headaches, very rarely has a headache. She was seen by ophthalmology in 04/2020 and reports eye exam was normal. She continues to work on [employer]. She is feeling well and without complaints.   10/29/2019 ALL:  Kimberly Villa is a 28 y.o. female here today for follow up for IIH. She continues Diamox  500mg  twice daily. Last eye exm was in 07/2019 which showed improvement of papilledema.  She reports that headaches are only improved.  She rarely has a bad headache.  She is continuing the conversation with her GYN provider regarding removal of Mirena implant.  She is also working with primary care for weight management.  HISTORY: (copied from my note on 08/06/2019)  Kimberly Villa is a 28 y.o. female here today for follow up of IIH. She reports that ophthalmology saw optic nerve edema and sent her for eval. She has not had much trouble with headaches. LP opening pressure of 41. MRI shows  empty sella. She was started on Diamox  250mg  twice daily. She reports that she can see clearly out of the right eye. Her left eye is still blurry. She reports that optic pressures had improved. She is seeing PCP next week. She continues to have sinus congestion. She is taking antihistamine and Singulair  daily. She denies fever, chills or sinus pain. [facility] but has not established an appt. She is working closely with GYN for consideration of continuing birth control (Mirena).    HISTORY: (copied from Dr Sharion note on 06/23/2019)   HPI:  Kimberly Villa is a 28 y.o. female here as requested by Maree Lonni Inks* for optic nerve head edema. Here with her mother who provides much information. She went to an optometrist who sent her to an ophthalmologist and now neurology. Vision changes started a year ago, stable, left eye is worse, no headaches or floater, she has episodes of blurry vision, she has fullness in her ear bit attributed to sinuses. She has had headaches, more pressure, on the right side, light sensitivity, no nausea, no vomiting, last headache a month ago improved with new glasses. She has an IUD for 5 years, she has gained weight, lots of stress with graduation of school. No head trauma. No other focal neurologic deficits, associated symptoms, inciting events or modifiable factors.   Reviewed notes, labs and imaging from outside physicians, which showed:    I reviewed Dr. Jamal notes.  No recent head  trauma.  No family history of sarcoidosis.  She wears an IUD.  No new headaches.  No diplopia.  No history of blood clots.  No vaping.  No passive smoke exposure.  There is a history of alcohol use and caffeine use.  OD 20/25 -2, OS 20/50 -2, intraocular pressures 19 OS and OD, examination reviewed and showed pupils equal round and reactive, no APD, open angles, extraocular movements full, external exam normal, slit-lamp unremarkable, funduscopic exam  showed normal optic nerves, visual field was enlarged blind spot OS, OCT HRT interpretation was positive disc edema bilaterally.  Bilateral disc edema with possibly early visual field changes.  Suspect IIH.   REVIEW OF SYSTEMS: Out of a complete 14 system review of symptoms, the patient complains only of the following symptoms, headaches and all other reviewed systems are negative.  ALLERGIES: Allergies  Allergen Reactions   Dust Mite Extract Other (See Comments)    Makes allergies flare up Makes allergies flare up     HOME MEDICATIONS: Outpatient Medications Prior to Visit  Medication Sig Dispense Refill   acetaZOLAMIDE  (DIAMOX ) 250 MG tablet Take 2 tablets (500 mg total) by mouth 2 (two) times daily. Please keep upcoming appt for further refills. 360 tablet 3   albuterol  (VENTOLIN  HFA) 108 (90 Base) MCG/ACT inhaler Inhale 1-2 puffs into the lungs every 6 (six) hours as needed for wheezing or shortness of breath. 18 g 3   cetirizine  (ZYRTEC ) 10 MG tablet TAKE 1 TABLET(10 MG) BY MOUTH DAILY 30 tablet 0   fluticasone  (FLOVENT  HFA) 110 MCG/ACT inhaler Inhale 2 puffs into the lungs in the morning and at bedtime. 1 Inhaler 12   mometasone  (NASONEX ) 50 MCG/ACT nasal spray Place 2 sprays into the nose daily. (Patient not taking: Reported on 10/29/2019) 17 g 12   No facility-administered medications prior to visit.    PAST MEDICAL HISTORY: Past Medical History:  Diagnosis Date   Asthma    IIH (idiopathic intracranial hypertension)     PAST SURGICAL HISTORY: Past Surgical History:  Procedure Laterality Date   previous surgery     No previous surgery    FAMILY HISTORY: Family History  Problem Relation Age of Onset   Multiple sclerosis Mother    Diabetes Maternal Grandmother    Pseudotumor cerebri Neg Hx    Migraines Neg Hx     SOCIAL HISTORY: Social History   Socioeconomic History   Marital status: Single    Spouse name: Not on file   Number of children: 0   Years of  education: Not on file   Highest education level: Bachelor's degree (e.g., BA, AB, BS)  Occupational History   Not on file  Tobacco Use   Smoking status: Never   Smokeless tobacco: Never  Vaping Use   Vaping status: Never Used  Substance and Sexual Activity   Alcohol use: Yes    Comment: wine occasionally   Drug use: No   Sexual activity: Yes    Birth control/protection: I.U.D.  Other Topics Concern   Not on file  Social History Narrative   Lives at home alone   Right handed   Caffeine: weekly, not daily   Social Drivers of Health   Financial Resource Strain: Not on file  Food Insecurity: Not on file  Transportation Needs: Not on file  Physical Activity: Not on file  Stress: Not on file  Social Connections: Unknown (03/29/2022)   [facility]   Social Network    Social Network: Not  on file  Intimate Partner Violence: Unknown (02/18/2022)   Received from [facility]   HITS    Physically Hurt: Not on file    Insult or Talk Down To: Not on file    Threaten Physical Harm: Not on file    Scream or Curse: Not on file      PHYSICAL EXAM  There were no vitals filed for this visit.  There is no height or weight on file to calculate BMI.  Generalized: Well developed, in no acute distress  Cardiology: normal rate and rhythm, no murmur noted Respiratory: Clear to auscultation bilaterally Neurological examination  Mentation: Alert oriented to time, place, history taking. Follows all commands speech and language fluent Cranial nerve II-XII: Pupils were equal round reactive to light. Extraocular movements were full, visual field were full on confrontational test.  Motor: The motor testing reveals 5 over 5 strength of all 4 extremities. Good symmetric motor tone is noted throughout.  Gait and station: Gait is normal.   DIAGNOSTIC DATA (LABS, IMAGING, TESTING) - I reviewed patient records, labs, notes, testing and imaging myself where available.      No  data to display           Lab Results  Component Value Date   WBC 7.5 06/18/2019   HGB 12.0 06/18/2019   HCT 38.9 06/18/2019   MCV 77 (L) 06/18/2019   PLT 478 (H) 06/18/2019      Component Value Date/Time   NA 140 09/17/2019 1354   K 4.6 09/17/2019 1354   CL 107 (H) 09/17/2019 1354   CO2 20 09/17/2019 1354   GLUCOSE 88 09/17/2019 1354   BUN 13 09/17/2019 1354   CREATININE 0.75 09/17/2019 1354   CALCIUM 9.3 09/17/2019 1354   PROT 6.7 09/17/2019 1354   ALBUMIN 4.1 09/17/2019 1354   AST 18 09/17/2019 1354   ALT 29 09/17/2019 1354   ALKPHOS 63 09/17/2019 1354   BILITOT 0.2 09/17/2019 1354   GFRNONAA 112 09/17/2019 1354   GFRAA 129 09/17/2019 1354   No results found for: CHOL, HDL, LDLCALC, LDLDIRECT, TRIG, CHOLHDL Lab Results  Component Value Date   HGBA1C 6.1 (H) 09/17/2019   No results found for: VITAMINB12 Lab Results  Component Value Date   TSH 1.350 06/18/2019    ASSESSMENT AND PLAN 28 y.o. year old female  has a past medical history of Asthma and IIH (idiopathic intracranial hypertension). here with   No diagnosis found.   Ms. Fong is doing very well on Diamox  500 mg twice daily.  We will continue current treatment regimen.  We have discussed etiology of idiopathic intracranial hypertension.  I have also provided her with additional educational materials in the office.  We have discussed data regarding concerns of worsening IIH with hormonal birth control as well as a data contradicting this claim.  She will continue the discussion of whether to continue with Mirena IUD with her GYN provider.  I would recommend nonhormonal forms of birth control if appropriate.  I have provided her with examples of these in her AVS.  She will continue close follow-up with primary care for weight management.  Blood pressure is elevated today however review of most recent blood pressures have been normal.  She reports that blood pressures are normal at home.  She will  continue to monitor closely.  She will follow-up with me in 6 months, sooner if needed she verbalizes understanding and agreement with this plan.   No orders of the defined  types were placed in this encounter.    No orders of the defined types were placed in this encounter.     Greig Forbes, FNP-C 07/17/2024, 4:28 PM [facility] [address], [address] [place], [place] [address] [phone]

## 2024-07-17 NOTE — Telephone Encounter (Signed)
 Request made

## 2024-07-17 NOTE — Patient Instructions (Signed)
 Below is our plan:  We will restart acetazolamide  250mg  twice daily for two weeks. If well tolerated, increase morning dose to 500mg  and continue 250mg  at bedtime for 1 week. If doing well, increase dose to 500mg  twice daily.   Please make sure you are staying well hydrated. I recommend 50-60 ounces daily. Well balanced diet and regular exercise encouraged. Consistent sleep schedule with 6-8 hours recommended.   Please continue follow up with care team as directed.   Follow up with me in 6 months   You may receive a survey regarding today's visit. I encourage you to leave honest feed back as I do use this information to improve patient care. Thank you for seeing me today!   GENERAL HEADACHE INFORMATION:   Natural supplements: Magnesium Oxide or Magnesium Glycinate 500 mg at bed (up to 800 mg daily) Coenzyme Q10 300 mg in AM Vitamin B2- 200 mg twice a day   Add 1 supplement at a time since even natural supplements can have undesirable side effects. You can sometimes buy supplements cheaper (especially Coenzyme Q10) at www.WebmailGuide.co.za or at [facility].  Migraine with aura: There is increased risk for stroke in women with migraine with aura and a contraindication for the combined contraceptive pill for use by women who have migraine with aura. The risk for women with migraine without aura is lower. However other risk factors like smoking are far more likely to increase stroke risk than migraine. There is a recommendation for no smoking and for the use of OCPs without estrogen such as progestogen only pills particularly for women with migraine with aura.SABRA People who have migraine headaches with auras may be 3 times more likely to have a stroke caused by a blood clot, compared to migraine patients who don't see auras. Women who take hormone-replacement therapy may be 30 percent more likely to suffer a clot-based stroke than women not taking medication containing estrogen. Other risk factors like smoking  and high blood pressure may be  much more important.    Vitamins and herbs that show potential:   Magnesium: Magnesium (250 mg twice a day or 500 mg at bed) has a relaxant effect on smooth muscles such as blood vessels. Individuals suffering from frequent or daily headache usually have low magnesium levels which can be increase with daily supplementation of 400-750 mg. Three trials found 40-90% average headache reduction  when used as a preventative. Magnesium may help with headaches are aura, the best evidence for magnesium is for migraine with aura is its thought to stop the cortical spreading depression we believe is the pathophysiology of migraine aura.Magnesium also demonstrated the benefit in menstrually related migraine.  Magnesium is part of the messenger system in the serotonin cascade and it is a good muscle relaxant.  It is also useful for constipation which can be a side effect of other medications used to treat migraine. Good sources include nuts, whole grains, and tomatoes. Side Effects: loose stool/diarrhea  Riboflavin (vitamin B 2) 200 mg twice a day. This vitamin assists nerve cells in the production of ATP a principal energy storing molecule.  It is necessary for many chemical reactions in the body.  There have been at least 3 clinical trials of riboflavin using 400 mg per day all of which suggested that migraine frequency can be decreased.  All 3 trials showed significant improvement in over half of migraine sufferers.  The supplement is found in bread, cereal, milk, meat, and poultry.  Most Americans get more riboflavin  than the recommended daily allowance, however riboflavin deficiency is not necessary for the supplements to help prevent headache. Side effects: energizing, green urine   Coenzyme Q10: This is present in almost all cells in the body and is critical component for the conversion of energy.  Recent studies have shown that a nutritional supplement of CoQ10 can reduce the  frequency of migraine attacks by improving the energy production of cells as with riboflavin.  Doses of 150 mg twice a day have been shown to be effective.   Melatonin: Increasing evidence shows correlation between melatonin secretion and headache conditions.  Melatonin supplementation has decreased headache intensity and duration.  It is widely used as a sleep aid.  Sleep is natures way of dealing with migraine.  A dose of 3 mg is recommended to start for headaches including cluster headache. Higher doses up to 15 mg has been reviewed for use in Cluster headache and have been used. The rationale behind using melatonin for cluster is that many theories regarding the cause of Cluster headache center around the disruption of the normal circadian rhythm in the brain.  This helps restore the normal circadian rhythm.   HEADACHE DIET: Foods and beverages which may trigger migraine Note that only 20% of headache patients are food sensitive. You will know if you are food sensitive if you get a headache consistently 20 minutes to 2 hours after eating a certain food. Only cut out a food if it causes headaches, otherwise you might remove foods you enjoy! What matters most for diet is to eat a well balanced healthy diet full of vegetables and low fat protein, and to not miss meals.   Chocolate, other sweets ALL cheeses except cottage and cream cheese Dairy products, yogurt, sour cream, ice cream Liver Meat extracts (Bovril, Marmite, meat tenderizers) Meats or fish which have undergone aging, fermenting, pickling or smoking. These include: Hotdogs,salami,Lox,sausage, mortadellas,smoked salmon, pepperoni, Pickled herring Pods of broad bean (English beans, Chinese pea pods, [place] (fava) beans, lima and navy beans Ripe avocado, ripe banana Yeast extracts or active yeast preparations such as Brewer's or Fleishman's (commercial bakes goods are permitted) Tomato based foods, pizza (lasagna, etc.)   MSG  (monosodium glutamate) is disguised as many things; look for these common aliases: Monopotassium glutamate Autolysed yeast Hydrolysed protein Sodium caseinate "flavorings" "all natural preservatives Nutrasweet   Avoid all other foods that convincingly provoke headaches.   Resources: The Dizzy Bluford Aid Your Headache Diet, migrainestrong.com  https://zamora-andrews.com/   Caffeine and Migraine For patients that have migraine, caffeine intake more than 3 days per week can lead to dependency and increased migraine frequency. I would recommend cutting back on your caffeine intake as best you can. The recommended amount of caffeine is 200-300 mg daily, although migraine patients may experience dependency at even lower doses. While you may notice an increase in headache temporarily, cutting back will be helpful for headaches in the long run. For more information on caffeine and migraine, visit: https://americanmigrainefoundation.org/resource-library/caffeine-and-migraine/   Headache Prevention Strategies:   1. Maintain a headache diary; learn to identify and avoid triggers.  - This can be a simple note where you log when you had a headache, associated symptoms, and medications used - There are several smartphone apps developed to help track migraines: Migraine Buddy, Migraine Monitor, Curelator N1-Headache App   Common triggers include: Emotional triggers: Emotional/Upset family or friends Emotional/Upset occupation Business reversal/success Anticipation anxiety Crisis-serious Post-crisis periodNew job/position   Physical triggers: Vacation Day Weekend Strenuous Exercise High Altitude  Location New Move Menstrual Day Physical Illness Oversleep/Not enough sleep Weather changes Light: Photophobia or light sesnitivity treatment involves a balance between desensitization and reduction in overly strong input. Use dark polarized glasses  outside, but not inside. Avoid bright or fluorescent light, but do not dim environment to the point that going into a normally lit room hurts. Consider FL-41 tint lenses, which reduce the most irritating wavelengths without blocking too much light.  These can be obtained at axonoptics.com or theraspecs.com Foods: see list above.   2. Limit use of acute treatments (over-the-counter medications, triptans, etc.) to no more than 2 days per week or 10 days per month to prevent medication overuse headache (rebound headache).     3. Follow a regular schedule (including weekends and holidays): Don't skip meals. Eat a balanced diet. 8 hours of sleep nightly. Minimize stress. Exercise 30 minutes per day. Being overweight is associated with a 5 times increased risk of chronic migraine. Keep well hydrated and drink 6-8 glasses of water per day.   4. Initiate non-pharmacologic measures at the earliest onset of your headache. Rest and quiet environment. Relax and reduce stress. Breathe2Relax is a free app that can instruct you on    some simple relaxtion and breathing techniques. Http://Dawnbuse.com is a    free website that provides teaching videos on relaxation.  Also, there are  many apps that   can be downloaded for "mindful" relaxation.  An app called YOGA NIDRA will help walk you through mindfulness. Another app called Calm can be downloaded to give you a structured mindfulness guide with daily reminders and skill development. Headspace for guided meditation Mindfulness Based Stress Reduction Online Course: www.palousemindfulness.com Cold compresses.   5. Don't wait!! Take the maximum allowable dosage of prescribed medication at the first sign of migraine.   6. Compliance:  Take prescribed medication regularly as directed and at the first sign of a migraine.   7. Communicate:  Call your physician when problems arise, especially if your headaches change, increase in frequency/severity, or become  associated with neurological symptoms (weakness, numbness, slurred speech, etc.). Proceed to emergency room if you experience new or worsening symptoms or symptoms do not resolve, if you have new neurologic symptoms or if headache is severe, or for any concerning symptom.   8. Headache/pain management therapies: Consider various complementary methods, including medication, behavioral therapy, psychological counselling, biofeedback, massage therapy, acupuncture, dry needling, and other modalities.  Such measures may reduce the need for medications. Counseling for pain management, where patients learn to function and ignore/minimize their pain, seems to work very well.   9. Recommend changing family's attention and focus away from patient's headaches. Instead, emphasize daily activities. If first question of day is 'How are your headaches/Do you have a headache today?', then patient will constantly think about headaches, thus making them worse. Goal is to re-direct attention away from headaches, toward daily activities and other distractions.   10. Helpful Websites: www.AmericanHeadacheSociety.org PatentHood.ch www.headaches.org TightMarket.nl www.achenet.org

## 2024-07-19 ENCOUNTER — Ambulatory Visit (INDEPENDENT_AMBULATORY_CARE_PROVIDER_SITE_OTHER): Payer: Self-pay | Admitting: Family Medicine

## 2024-07-19 ENCOUNTER — Encounter: Payer: Self-pay | Admitting: Family Medicine

## 2024-07-19 VITALS — BP 118/80 | Resp 16 | Ht 63.0 in | Wt 232.0 lb

## 2024-07-19 DIAGNOSIS — G932 Benign intracranial hypertension: Secondary | ICD-10-CM

## 2024-07-19 MED ORDER — ACETAZOLAMIDE 250 MG PO TABS: 500.0000 mg | ORAL_TABLET | Freq: Two times a day (BID) | ORAL | 3 refills | Status: AC

## 2024-10-23 ENCOUNTER — Ambulatory Visit: Payer: Self-pay | Admitting: Neurology

## 2025-07-25 ENCOUNTER — Telehealth: Admitting: Family Medicine

## 2025-07-30 ENCOUNTER — Telehealth: Admitting: Family Medicine
